# Patient Record
Sex: Female | Born: 1990 | Race: White | Hispanic: No | Marital: Single | Smoking: Never smoker
Health system: Southern US, Community
[De-identification: ages and names within clinical notes are randomized; demographics above are authoritative.]

## PROBLEM LIST (undated history)

## (undated) DIAGNOSIS — I7300 Raynaud's syndrome without gangrene: Secondary | ICD-10-CM

## (undated) DIAGNOSIS — R519 Headache, unspecified: Secondary | ICD-10-CM

## (undated) DIAGNOSIS — G43719 Chronic migraine without aura, intractable, without status migrainosus: Secondary | ICD-10-CM

## (undated) DIAGNOSIS — F5104 Psychophysiologic insomnia: Secondary | ICD-10-CM

## (undated) DIAGNOSIS — R51 Headache: Secondary | ICD-10-CM

## (undated) DIAGNOSIS — E079 Disorder of thyroid, unspecified: Secondary | ICD-10-CM

## (undated) DIAGNOSIS — G8929 Other chronic pain: Secondary | ICD-10-CM

## (undated) HISTORY — PX: WISDOM TOOTH EXTRACTION: SHX21

## (undated) HISTORY — DX: Raynaud's syndrome without gangrene: I73.00

## (undated) HISTORY — DX: Chronic migraine without aura, intractable, without status migrainosus: G43.719

## (undated) HISTORY — DX: Psychophysiologic insomnia: F51.04

---
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
## 2011-01-24 ENCOUNTER — Ambulatory Visit: Payer: Managed Care, Other (non HMO) | Attending: Neurology | Admitting: Physical Therapy

## 2011-01-24 DIAGNOSIS — IMO0001 Reserved for inherently not codable concepts without codable children: Secondary | ICD-10-CM | POA: Insufficient documentation

## 2011-01-24 DIAGNOSIS — M542 Cervicalgia: Secondary | ICD-10-CM | POA: Insufficient documentation

## 2011-01-24 DIAGNOSIS — R51 Headache: Secondary | ICD-10-CM | POA: Insufficient documentation

## 2011-01-31 ENCOUNTER — Ambulatory Visit: Payer: Managed Care, Other (non HMO) | Admitting: Physical Therapy

## 2011-02-07 ENCOUNTER — Ambulatory Visit: Payer: Managed Care, Other (non HMO) | Admitting: Physical Therapy

## 2011-02-09 ENCOUNTER — Encounter: Payer: Managed Care, Other (non HMO) | Admitting: Physical Therapy

## 2011-02-14 ENCOUNTER — Encounter: Payer: Managed Care, Other (non HMO) | Admitting: Physical Therapy

## 2011-11-18 ENCOUNTER — Emergency Department (HOSPITAL_COMMUNITY)
Admission: EM | Admit: 2011-11-18 | Discharge: 2011-11-18 | Disposition: A | Discharge status: Home / Self Care | Payer: Managed Care, Other (non HMO) | Attending: Emergency Medicine | Admitting: Emergency Medicine

## 2011-11-18 ENCOUNTER — Encounter (HOSPITAL_COMMUNITY): Payer: Self-pay | Admitting: *Deleted

## 2011-11-18 DIAGNOSIS — R51 Headache: Secondary | ICD-10-CM | POA: Insufficient documentation

## 2011-11-18 HISTORY — DX: Headache: R51

## 2011-11-18 HISTORY — DX: Other chronic pain: G89.29

## 2011-11-18 HISTORY — DX: Headache, unspecified: R51.9

## 2011-11-18 MED ORDER — DIPHENHYDRAMINE HCL 50 MG/ML IJ SOLN
25.0000 mg | Freq: Once | INTRAMUSCULAR | Status: AC
  Administered 2011-11-18: 25 mg via INTRAVENOUS

## 2011-11-18 MED ORDER — DEXAMETHASONE SODIUM PHOSPHATE 10 MG/ML IJ SOLN
10.0000 mg | Freq: Once | INTRAMUSCULAR | Status: AC
  Administered 2011-11-18: 10 mg via INTRAVENOUS
  Filled 2011-11-18: qty 1

## 2011-11-18 MED ORDER — LORAZEPAM 1 MG PO TABS: 1.0000 mg | ORAL_TABLET | Freq: Once | ORAL | Status: DC

## 2011-11-18 MED ORDER — SODIUM CHLORIDE 0.9 % IV BOLUS (SEPSIS)
1000.0000 mL | Freq: Once | INTRAVENOUS | Status: AC
  Administered 2011-11-18: 1000 mL via INTRAVENOUS

## 2011-11-18 MED ORDER — DIPHENHYDRAMINE HCL 50 MG/ML IJ SOLN
INTRAMUSCULAR | Status: AC
  Filled 2011-11-18: qty 1

## 2011-11-18 MED ORDER — LORAZEPAM 2 MG/ML IJ SOLN
INTRAMUSCULAR | Status: AC
  Filled 2011-11-18: qty 1

## 2011-11-18 MED ORDER — DIPHENHYDRAMINE HCL 50 MG/ML IJ SOLN
25.0000 mg | Freq: Once | INTRAMUSCULAR | Status: AC
  Administered 2011-11-18: 25 mg via INTRAVENOUS
  Filled 2011-11-18: qty 1

## 2011-11-18 MED ORDER — METOCLOPRAMIDE HCL 5 MG/ML IJ SOLN
10.0000 mg | Freq: Once | INTRAMUSCULAR | Status: AC
  Administered 2011-11-18: 10 mg via INTRAVENOUS
  Filled 2011-11-18: qty 2

## 2011-11-18 MED ORDER — LORAZEPAM 2 MG/ML IJ SOLN
1.0000 mg | Freq: Once | INTRAMUSCULAR | Status: AC
  Administered 2011-11-18: 23:00:00 via INTRAVENOUS

## 2011-11-18 NOTE — ED Notes (Signed)
Mother present- Neuro doctor called and was told to come here for evaluation.  Pt alert and active with care- no new symptoms-warm blanket given

## 2011-11-18 NOTE — ED Provider Notes (Signed)
History     CSN: [id number]  Arrival date & time 11/18/11  4098   First MD Initiated Contact with Patient 11/18/11 2218      Chief Complaint  Patient presents with  . Headache    see Dr Anne Hahn for headaches,  worse today  concentrated in back of head into neck    (Consider location/radiation/quality/duration/timing/severity/associated sxs/prior treatment) HPI History provided by pt and her mother.  Pt reports chronic, daily headaches x 2 years for which she sees Dr. Anne Hahn.  For the past two days, she has had a severe, constant, throbbing, occipital headache w/ radiation into neck and upper back.  No modifying factors.  No associated fever, photo/phonophobia, dizziness, vision changes, ataxia, N/V.  Characteristics of pain are typical w/ exception that the pain is worse than normal.  No recent trauma.  Has been compliant w/ her lyrica and amrix.  Her mother says that she been told by her neurologist not to take OTC pain medications.   Past Medical History  Diagnosis Date  . Chronic headaches     No past surgical history on file.  History reviewed. No pertinent family history.  History  Substance Use Topics  . Smoking status: Not on file  . Smokeless tobacco: Not on file  . Alcohol Use:     OB History    Grav Para Term Preterm Abortions TAB SAB Ect Mult Living                  Review of Systems  All other systems reviewed and are negative.    Allergies  Review of patient's allergies indicates no known allergies.  Home Medications   Current Outpatient Rx  Name Route Sig Dispense Refill  . CYCLOBENZAPRINE HCL ER 30 MG PO CP24 Oral Take 30 mg by mouth daily.    Marland Kitchen NORGESTIMATE-ETH ESTRADIOL 0.25-35 MG-MCG PO TABS Oral Take 1 tablet by mouth daily.    Marland Kitchen PREGABALIN 100 MG PO CAPS Oral Take 100 mg by mouth 2 (two) times daily.      BP 140/94  Pulse 105  Temp(Src) 98.8 F (37.1 C) (Oral)  Resp 20  Wt 140 lb (63.504 kg)  SpO2 100%  LMP 11/11/2011  Physical  Exam  Nursing note and vitals reviewed. Constitutional: She is oriented to person, place, and time. She appears well-developed and well-nourished.  HENT:  Head: Normocephalic and atraumatic.  Eyes:       Normal appearance  Neck: Normal range of motion. Neck supple. No rigidity. No Brudzinski's sign and no Kernig's sign noted.  Cardiovascular: Normal rate and regular rhythm.   Pulmonary/Chest: Effort normal and breath sounds normal.  Neurological: She is alert and oriented to person, place, and time. No cranial nerve deficit or sensory deficit. Coordination normal.       5/5 and equal upper and lower extremity strength.  No past pointing.    Skin: Skin is warm and dry. No rash noted.  Psychiatric: She has a normal mood and affect. Her behavior is normal.    ED Course  Procedures (including critical care time)  Labs Reviewed - No data to display No results found.   1. Headache       MDM  20yo F w/ daily headaches x 2 years for which she sees a neurologist, presents w/ 2d  non-traumatic, occipital headache.  Characteristics of pain typical w/ exception of severity.  Well-appearing, afebrile, no meningeal signs or focal neuro deficits on exam.  Pt receiving IV fluids,  reglan, decadron and benadryl.  Will reassess shortly.    Pt had a reaction to reglan.  Became restless and anxious and tearful and developed hives on chest and back.  Treated w/ a second dose of benadryl as well as 1mg  IV ativan.  Pt fell asleep but was easily aroused and VSS.  She reports that her headache is improved and she is ready for discharge.  She lives with her parents who are both present.  I advised them to add reglan to her list of allergies and f/u with Dr. Anne Hahn this week.  Return precautions discussed.         Otilio Miu, [place] 11/18/11 [phone]

## 2011-11-18 NOTE — ED Notes (Signed)
Headaches for two years,  "just worse today, down into neck"

## 2011-11-18 NOTE — ED Provider Notes (Signed)
Medical screening examination/treatment/procedure(s) were performed by non-physician practitioner and as supervising physician I was immediately available for consultation/collaboration.   Mikael Debell, MD 11/18/11 2344 

## 2011-11-18 NOTE — Discharge Instructions (Signed)
Follow up with your neurologist this week.  You should return to the ER if you develop worsening pain, associated fever or any other concerning symptoms.

## 2011-11-22 ENCOUNTER — Encounter (HOSPITAL_COMMUNITY): Payer: Self-pay | Admitting: Emergency Medicine

## 2011-11-22 ENCOUNTER — Emergency Department (HOSPITAL_COMMUNITY)
Admission: EM | Admit: 2011-11-22 | Discharge: 2011-11-22 | Disposition: A | Discharge status: Home / Self Care | Payer: Managed Care, Other (non HMO) | Attending: Emergency Medicine | Admitting: Emergency Medicine

## 2011-11-22 ENCOUNTER — Emergency Department (HOSPITAL_COMMUNITY): Payer: Managed Care, Other (non HMO)

## 2011-11-22 DIAGNOSIS — R42 Dizziness and giddiness: Secondary | ICD-10-CM | POA: Insufficient documentation

## 2011-11-22 DIAGNOSIS — R0602 Shortness of breath: Secondary | ICD-10-CM | POA: Insufficient documentation

## 2011-11-22 DIAGNOSIS — R000 Tachycardia, unspecified: Secondary | ICD-10-CM | POA: Insufficient documentation

## 2011-11-22 LAB — CBC
Hemoglobin: 14.6 g/dL (ref 12.0–15.0)
MCH: 27.9 pg (ref 26.0–34.0)
MCHC: 33.2 g/dL (ref 30.0–36.0)
MCV: 84.1 fL (ref 78.0–100.0)
RBC: 5.23 MIL/uL — ABNORMAL HIGH (ref 3.87–5.11)

## 2011-11-22 LAB — POCT I-STAT, CHEM 8
BUN: 14 mg/dL (ref 6–23)
Calcium, Ion: 1.18 mmol/L (ref 1.12–1.32)
Creatinine, Ser: 0.9 mg/dL (ref 0.50–1.10)
TCO2: 25 mmol/L (ref 0–100)

## 2011-11-22 MED ORDER — LORAZEPAM 1 MG PO TABS
1.0000 mg | ORAL_TABLET | Freq: Once | ORAL | Status: AC
  Administered 2011-11-22: 1 mg via ORAL
  Filled 2011-11-22: qty 1

## 2011-11-22 MED ORDER — ALBUTEROL SULFATE (5 MG/ML) 0.5% IN NEBU
5.0000 mg | INHALATION_SOLUTION | Freq: Once | RESPIRATORY_TRACT | Status: AC
  Administered 2011-11-22: 5 mg via RESPIRATORY_TRACT
  Filled 2011-11-22: qty 1

## 2011-11-22 NOTE — ED Notes (Signed)
Peak flow pre nebulizer 230, peak flow post nebulizer 260

## 2011-11-22 NOTE — Progress Notes (Signed)
Peak flow post 260 best out of 3

## 2011-11-22 NOTE — ED Notes (Signed)
RT reported peak flow was 230. Will alert PA

## 2011-11-22 NOTE — ED Provider Notes (Signed)
History     CSN: [id number]  Arrival date & time 11/22/11  1230   First MD Initiated Contact with Patient 11/22/11 1318      Chief Complaint  Patient presents with  . Shortness of Breath    (Consider location/radiation/quality/duration/timing/severity/associated sxs/prior treatment) HPI Comments: Patient with a history of chronic headaches presents emergency Department with chief complaint of shortness of breath.  Onset of symptoms began at 9 o'clock this morning and are described as not being able to take a full breath in.  Associated symptoms include lightheadedness.  Patient states she has not done anything differently with the exception of taking tramadol last evening at 9 p.m. for her headache.  Patient denies a feeling that her throat is closing, any lip or facial swelling, hives, difficulty swallowing, drooling, change in her voice, cough, chest pain or tightness, extremity swelling, claudication, fevers, night sweats or chills.  Patient has no other complaints at this time.  Patient's mother states that her daughter has been having mild anxiety lately and feels that this may be related. Pt is on birthcontrol, denies smoking, recent travel or surgery. No hx of CA.  Patient is a 21 y.o. female presenting with shortness of breath. The history is provided by the patient.  Shortness of Breath  Associated symptoms include shortness of breath. Pertinent negatives include no chest pain, no fever, no sore throat, no stridor, no cough and no wheezing.    Past Medical History  Diagnosis Date  . Chronic headaches     History reviewed. No pertinent past surgical history.  History reviewed. No pertinent family history.  History  Substance Use Topics  . Smoking status: Never Smoker   . Smokeless tobacco: Not on file  . Alcohol Use: No    OB History    Grav Para Term Preterm Abortions TAB SAB Ect Mult Living                  Review of Systems  Constitutional: Negative for fever,  chills and appetite change.  HENT: Negative for congestion, sore throat, drooling and trouble swallowing.   Eyes: Negative for visual disturbance.  Respiratory: Positive for shortness of breath. Negative for cough, wheezing and stridor.   Cardiovascular: Negative for chest pain and leg swelling.  Gastrointestinal: Negative for abdominal pain.  Genitourinary: Negative for dysuria, urgency and frequency.  Neurological: Positive for light-headedness. Negative for dizziness, syncope, weakness, numbness and headaches.  Psychiatric/Behavioral: Negative for confusion.    Allergies  Reglan  Home Medications   Current Outpatient Rx  Name Route Sig Dispense Refill  . CYCLOBENZAPRINE HCL ER 30 MG PO CP24 Oral Take 30 mg by mouth daily.    Marland Kitchen NORGESTIMATE-ETH ESTRADIOL 0.25-35 MG-MCG PO TABS Oral Take 1 tablet by mouth daily.    Marland Kitchen PREGABALIN 100 MG PO CAPS Oral Take 100 mg by mouth 2 (two) times daily.      BP 127/84  Pulse 120  Temp 98.3 F (36.8 C)  Resp 22  SpO2 100%  LMP 11/11/2011  Physical Exam  Nursing note and vitals reviewed. Constitutional: She is oriented to person, place, and time. She appears well-developed and well-nourished. No distress.  HENT:  Head: Normocephalic and atraumatic.  Mouth/Throat: Oropharynx is clear and moist and mucous membranes are normal.       No sign of airway obstruction. No edema of face, eyelids, lips, tongue, uvula.Marland Kitchen Uvula midline, no nasal congestion or drooling.  Tongue not elevated. No trismus.  Neck: Trachea normal, normal  range of motion and full passive range of motion without pain. Neck supple. Carotid bruit is not present. No tracheal deviation present.       No carotid bruits or stridor  Cardiovascular: Normal rate, regular rhythm, intact distal pulses and normal pulses.        Mild tachycardia, HR 109 Normal heart sounds, no murmur, JVD, intact distal pulses  Pulmonary/Chest: Effort normal. No stridor.       LCAB, pt able to speak in  full sentences, O2 sat 100% on RA  Musculoskeletal: Normal range of motion.  Neurological: She is alert and oriented to person, place, and time.  Skin: Skin is warm and intact. No rash noted. Rash is not urticarial. She is not diaphoretic.       Not diaphoretic. No urticaria, petechiae or purpura.   Psychiatric: She has a normal mood and affect. Her behavior is normal.    ED Course  Procedures (including critical care time)  Labs Reviewed - No data to display No results found.   No diagnosis found.  PF 230 pre neb, 260 post neb  MDM  SOB  Not likely cardiopulmonary etiology based on presentation,  PE, negative CXR &ddimer. Non concerning for allergic rxn. Pt able to maintain oxygen saturation of 100% on RA throughout hospital visit. Breathing improved with neb treatment and ativan. Pt in NAD prior to dc. Pt to f-u with PCP and is agreeable w plan to dc        Jaci Carrel, PA-C 11/22/11 1732

## 2011-11-22 NOTE — ED Notes (Signed)
Pt reports difficulty breathing since 9 am this morning. Pt able to speak in complete sentences. Pt denies pain but says she does not feel like she is able to get a full breath. Pt was seen here at [facility] for headaches that she is regularly scene by DR Anne Hahn. Pt dx is persistent headaches from Dr Anne Hahn.

## 2011-11-22 NOTE — Progress Notes (Signed)
Peak flow results 230 best out of 3

## 2011-11-22 NOTE — Discharge Instructions (Signed)
Shortness of Breath Shortness of breath (dyspnea) is the feeling of uneasy breathing. Shortness of breath does not always mean that there is a life-threatening illness. However, shortness of breath requires immediate medical care. CAUSES  Causes for shortness of breath include:  Not enough oxygen in the air (as with high altitudes or with a smoke-filled room).   Short-term (acute) lung disease, including:   Infections such as pneumonia.   Fluid in the lungs, such as heart failure.   A blood clot in the lungs (pulmonary embolism).   Lasting (chronic) lung diseases.   Heart disease (heart attack, angina, heart failure, and others).   Low red blood cells (anemia).   Poor physical fitness. This can cause shortness of breath when you exercise.   Chest or back injuries or stiffness.   Being overweight (obese).   Anxiety. This can make you feel like you are not getting enough air.  DIAGNOSIS  Serious medical problems can usually be found during your physical exam. Many tests may also be done to determine why you are having shortness of breath. Tests include:  Chest X-rays.   Lung function tests.   Blood tests.   Electrocardiography.   Exercise testing.   A cardiac echo.   Imaging scans.  Your caregiver may not be able to find a cause for your shortness of breath after your exam. In this case, it is important to have a follow-up exam with your caregiver as directed.  HOME CARE INSTRUCTIONS   Do not smoke. Smoking is a common cause of shortness of breath. Ask for help to stop smoking.   Avoid being around chemicals that may bother your breathing (paint fumes, dust).   Rest as needed. Slowly resume your usual activities.   If medicines were prescribed, take them as directed for the full length of time directed. This includes oxygen and any inhaled medicines.   Follow up with your caregiver as directed. Waiting to do so or failure to follow up could result in worsening of  your condition and possible disability or death.   Be sure you understand what to do or who to call if your shortness of breath worsens.  SEEK MEDICAL CARE IF:   Your condition does not improve in the time expected.   You have a hard time doing your normal activities even with rest.   You have any side effects or problems with the medicines prescribed.   You develop any new symptoms.  SEEK IMMEDIATE MEDICAL CARE IF:   Your shortness of breath is getting worse.   You feel lightheaded, faint, or develop a cough not controlled with medicines.   You start coughing up blood.   You have pain with breathing.   You have chest pain or pain in your arms, shoulders, or abdomen.   You have a fever.   You are unable to walk up stairs or exercise the way you normally do.   Your symptoms are getting worse.  Document Released: 05/01/2001 Document Revised: 07/26/2011 Document Reviewed: 12/17/2007 Exit[employer] Patient Information 2012 [employer]. 

## 2011-11-23 NOTE — ED Provider Notes (Signed)
History/physical exam/procedure(s) were performed by non-physician practitioner and as supervising physician I was immediately available for consultation/collaboration. I have reviewed all notes and am in agreement with care and plan.   Hilario Quarry, MD 11/23/11 [phone]

## 2012-02-06 ENCOUNTER — Ambulatory Visit: Payer: Managed Care, Other (non HMO) | Admitting: Emergency Medicine

## 2012-02-06 VITALS — BP 121/79 | HR 102 | Temp 97.6°F | Resp 16 | Ht 67.0 in | Wt 144.0 lb

## 2012-02-06 DIAGNOSIS — M5412 Radiculopathy, cervical region: Secondary | ICD-10-CM

## 2012-02-06 DIAGNOSIS — G5680 Other specified mononeuropathies of unspecified upper limb: Secondary | ICD-10-CM

## 2012-02-06 DIAGNOSIS — G5620 Lesion of ulnar nerve, unspecified upper limb: Secondary | ICD-10-CM

## 2012-02-06 DIAGNOSIS — M25519 Pain in unspecified shoulder: Secondary | ICD-10-CM

## 2012-02-06 NOTE — Progress Notes (Signed)
  Subjective:    Patient ID: Tracey Ellison, female    DOB: April 20, 1991, 21 y.o.   MRN: [id number]  Shoulder Pain  The pain is present in the neck, right fingers, right elbow and right shoulder. This is a new problem. The current episode started in the past 7 days. There has been a history of trauma. The problem occurs constantly. The problem has been gradually worsening. The quality of the pain is described as burning. The pain is moderate. Pertinent negatives include no fever, inability to bear weight, itching, joint locking, joint swelling, limited range of motion, numbness, stiffness or tingling. The symptoms are aggravated by activity and contact. She has tried OTC pain meds for the symptoms. The treatment provided no relief. Family history does not include gout or rheumatoid arthritis. There is no history of diabetes, gout, osteoarthritis or rheumatoid arthritis.      Review of Systems  Constitutional: Negative.  Negative for fever.  HENT: Negative.   Respiratory: Negative.   Gastrointestinal: Negative.   Musculoskeletal: Negative.  Negative for stiffness and gout.  Skin: Positive for color change (thumb is discolored). Negative for itching.  Neurological: Negative.  Negative for tingling and numbness.       Objective:   Physical Exam  Constitutional: She is oriented to person, place, and time. She appears well-developed and well-nourished.  HENT:  Head: Normocephalic and atraumatic.  Eyes: Conjunctivae are normal. Pupils are equal, round, and reactive to light. No scleral icterus.  Neck: Normal range of motion. Neck supple.  Cardiovascular: Normal rate and intact distal pulses.   Pulmonary/Chest: Effort normal and breath sounds normal.  Abdominal: Soft.  Musculoskeletal: Normal range of motion. She exhibits tenderness.       Cervical back: She exhibits tenderness.       Back:       Right forearm: She exhibits tenderness.       Arms: Neurological: She is alert and oriented to  person, place, and time. She exhibits normal muscle tone.  Skin: Skin is warm and dry.          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  Now has pain in the top-posterior right shoulder with pain over ulnar notch and down forearm into right thumb that feels itchy and cold and pallid at the tip.  She has a trigger point tenderness posterior shoulder which increases all her pain symptoms.  Capillary refill normal bilaterally.  Scapulocostal syndrome.    Discussed with mom and patient who elected to try trigger point injection and OTC aleve (double dose) and continue with her cyclobenzaprine and local heat.   Will follow up with her neurologist for new or worsened symptoms or failure to improve

## 2012-05-29 DIAGNOSIS — S139XXA Sprain of joints and ligaments of unspecified parts of neck, initial encounter: Secondary | ICD-10-CM | POA: Insufficient documentation

## 2012-10-24 IMAGING — CR DG CHEST 2V
2 series · 2 of 2 positions shown · non-contrast
Comparison: None.

CLINICAL DATA: Shortness of breath

CHEST - 2 VIEW

[w chest pa]
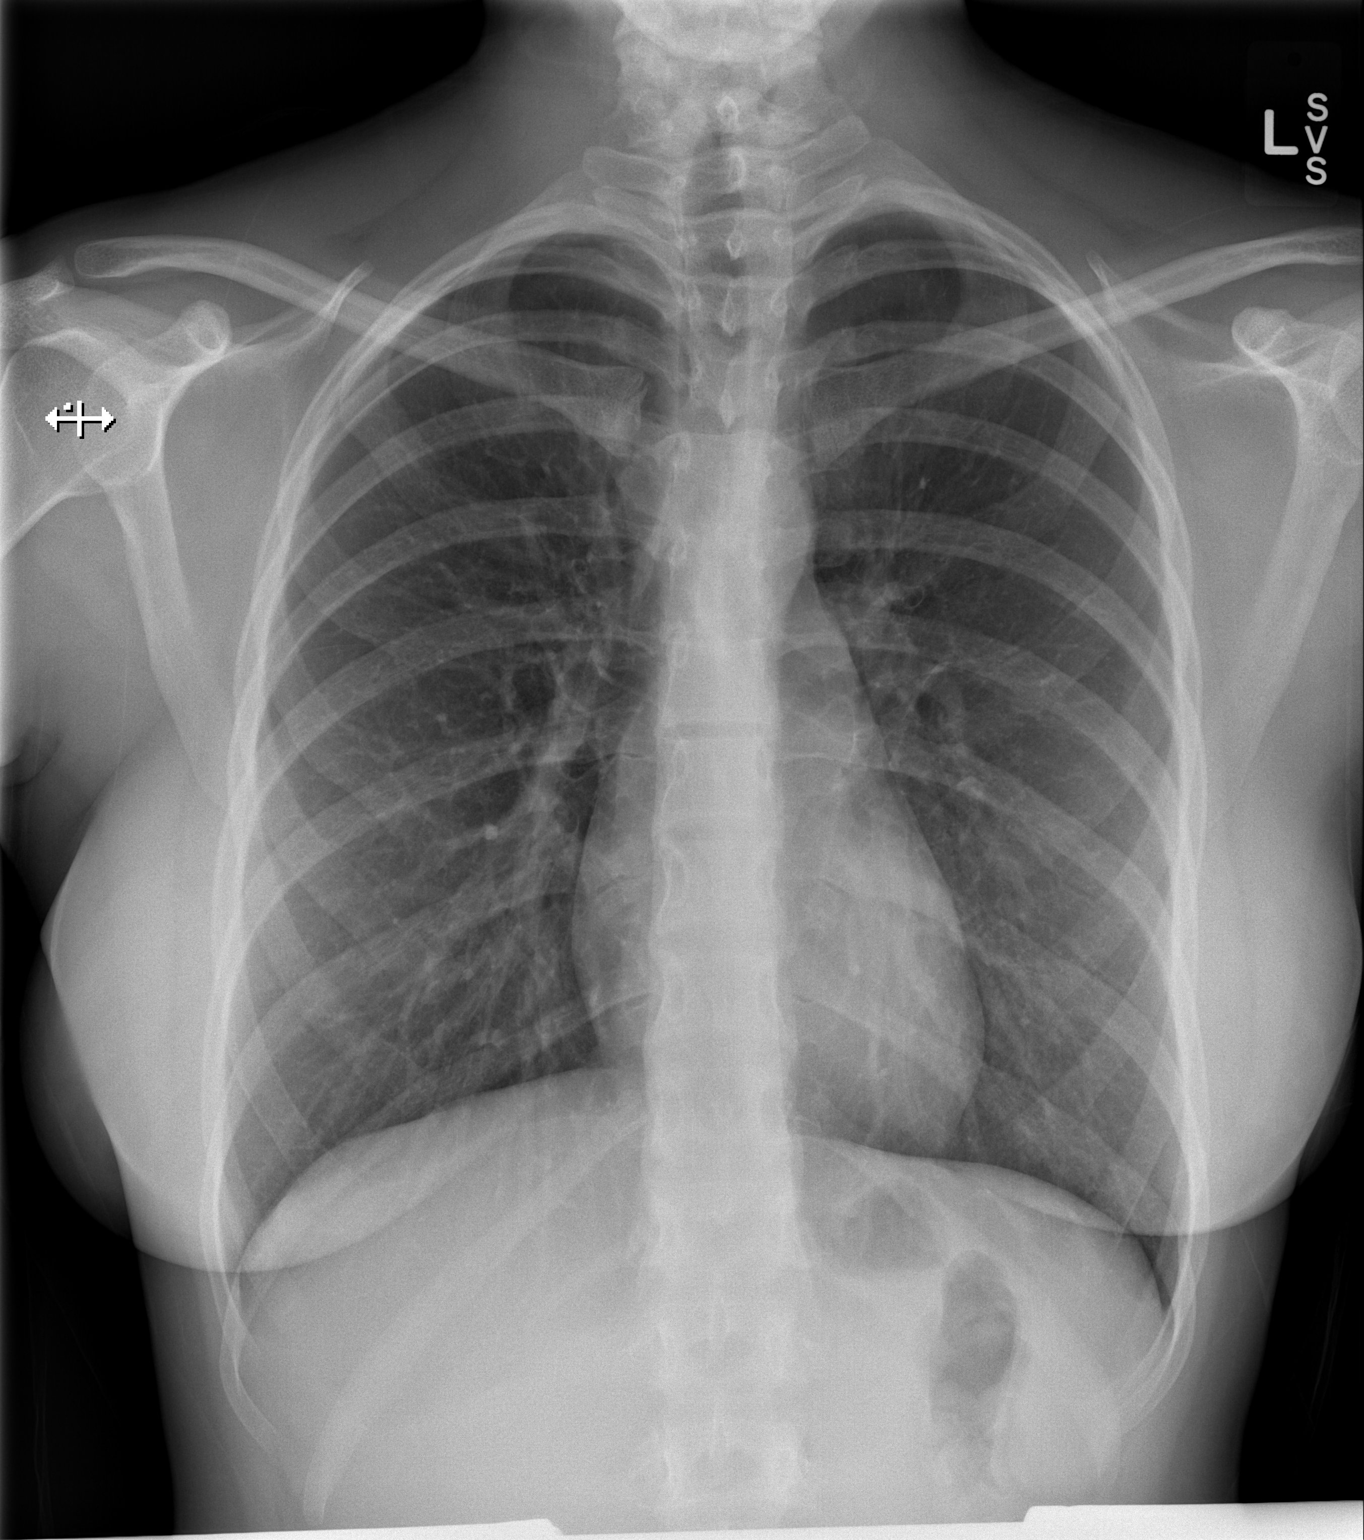

[w chest lat]
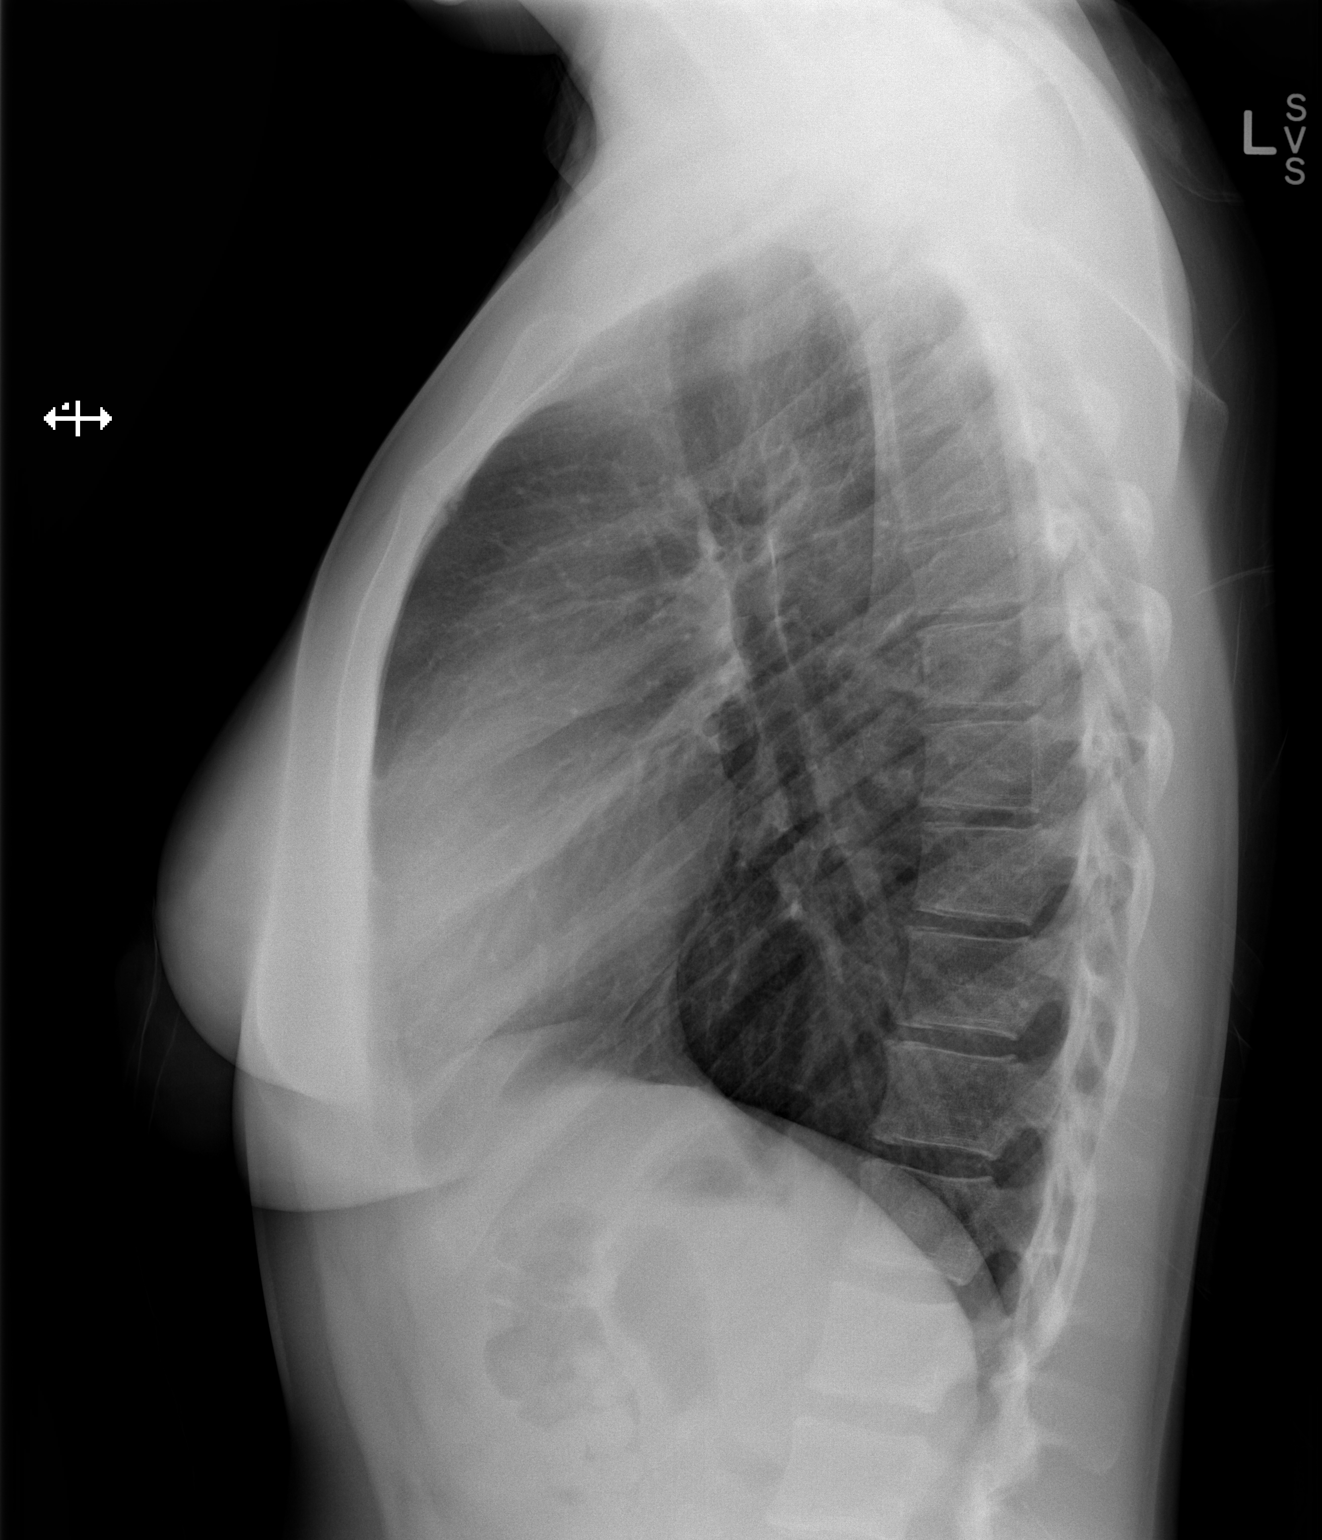

[2 of 2 positions shown; findings below may reference images not displayed]

FINDINGS: The lungs are clear.  Mediastinal contours appear normal.
The heart is within normal limits in size.  No bony abnormality is
seen.
IMPRESSION: No active lung disease.

## 2012-11-07 ENCOUNTER — Telehealth: Payer: Self-pay | Admitting: *Deleted

## 2012-11-07 NOTE — Telephone Encounter (Signed)
I called the mother. The patient has had some weight gain on Lexapro and Lyrica. These medications can cause weight gain. There is an interaction with Wellbutrin and Pristiq with the Amrix that she is on. We will keep her on her current dose. The patient will need to watch her diet, and exercise.

## 2012-11-07 NOTE — Telephone Encounter (Signed)
Patient's mother called stating the patient is gaining weight and its not helping the depression. Patient's mother would like to know if the lexapro or lyrica could be causing the patient weight.

## 2012-11-11 ENCOUNTER — Telehealth: Payer: Self-pay | Admitting: *Deleted

## 2012-11-11 MED ORDER — PREGABALIN 75 MG PO CAPS: 75.0000 mg | ORAL_CAPSULE | Freq: Two times a day (BID) | ORAL | Status: DC

## 2012-11-11 NOTE — Telephone Encounter (Signed)
I called the mother and I left a message. The patient has had some weight gain on the Lyrica, and I will decrease the dose to 75 mg twice daily from 100 mg twice daily.

## 2012-11-11 NOTE — Telephone Encounter (Signed)
Patient's mother called wanting to know if they can decrease the dosage of Lyrica.

## 2012-12-02 ENCOUNTER — Telehealth: Payer: Self-pay | Admitting: *Deleted

## 2012-12-02 MED ORDER — PREGABALIN 50 MG PO CAPS: 50.0000 mg | ORAL_CAPSULE | Freq: Two times a day (BID) | ORAL | Status: DC

## 2012-12-02 NOTE — Telephone Encounter (Signed)
Patient's mother called stating Tracey Ellison hasn't had any problem with the first decrease of Lyrica. Patient's mother would like another prescription decreasing the dose again another 25 mg, so she would be 50 mg in the morning and 50 mg in the evening.

## 2012-12-02 NOTE — Telephone Encounter (Signed)
I will decrease the dose to 50 mg twice daily of the Lyrica. I'll call in a prescription.

## 2012-12-08 ENCOUNTER — Other Ambulatory Visit: Payer: Self-pay | Admitting: Neurology

## 2012-12-27 ENCOUNTER — Encounter (HOSPITAL_BASED_OUTPATIENT_CLINIC_OR_DEPARTMENT_OTHER): Payer: Self-pay | Admitting: *Deleted

## 2012-12-27 ENCOUNTER — Emergency Department (HOSPITAL_BASED_OUTPATIENT_CLINIC_OR_DEPARTMENT_OTHER)
Admission: EM | Admit: 2012-12-27 | Discharge: 2012-12-28 | Disposition: A | Discharge status: Home / Self Care | Payer: Managed Care, Other (non HMO) | Attending: Emergency Medicine | Admitting: Emergency Medicine

## 2012-12-27 DIAGNOSIS — Z3202 Encounter for pregnancy test, result negative: Secondary | ICD-10-CM | POA: Insufficient documentation

## 2012-12-27 DIAGNOSIS — R51 Headache: Secondary | ICD-10-CM | POA: Insufficient documentation

## 2012-12-27 DIAGNOSIS — Z79899 Other long term (current) drug therapy: Secondary | ICD-10-CM | POA: Insufficient documentation

## 2012-12-27 MED ORDER — KETOROLAC TROMETHAMINE 30 MG/ML IJ SOLN
30.0000 mg | Freq: Once | INTRAMUSCULAR | Status: AC
  Administered 2012-12-27: 30 mg via INTRAVENOUS
  Filled 2012-12-27: qty 1

## 2012-12-27 MED ORDER — SODIUM CHLORIDE 0.9 % IV SOLN
Freq: Once | INTRAVENOUS | Status: AC
  Administered 2012-12-27: 22:00:00 via INTRAVENOUS

## 2012-12-27 MED ORDER — LORAZEPAM 2 MG/ML IJ SOLN
1.0000 mg | Freq: Once | INTRAMUSCULAR | Status: AC
  Administered 2012-12-27: 1 mg via INTRAVENOUS
  Filled 2012-12-27: qty 1

## 2012-12-27 NOTE — ED Provider Notes (Signed)
History     CSN: [id number]  Arrival date & time 12/27/12  1945   First MD Initiated Contact with Patient 12/27/12 2123      Chief Complaint  Patient presents with  . Headache    (Consider location/radiation/quality/duration/timing/severity/associated sxs/prior treatment) Patient is a 22 y.o. female presenting with headaches. The history is provided by the patient. No language interpreter was used.  Headache Pain location:  Generalized Quality:  Sharp Severity at highest:  10/10 Onset quality:  Gradual Duration:  1 day Timing:  Intermittent Progression:  Worsening Relieved by:  Nothing Worsened by:  Nothing tried Ineffective treatments:  Prescription medications Risk factors: no anger   Pt complains of a headache that has not resolved with current medications.   Past Medical History  Diagnosis Date  . Chronic headaches     Past Surgical History  Procedure Laterality Date  . Wisdom tooth extraction      History reviewed. No pertinent family history.  History  Substance Use Topics  . Smoking status: Never Smoker   . Smokeless tobacco: Not on file  . Alcohol Use: No    OB History   Grav Para Term Preterm Abortions TAB SAB Ect Mult Living                  Review of Systems  Neurological: Positive for headaches.  All other systems reviewed and are negative.    Allergies  Metoclopramide hcl  Home Medications   Current Outpatient Rx  Name  Route  Sig  Dispense  Refill  . cyclobenzaprine (AMRIX) 30 MG 24 hr capsule   Oral   Take 30 mg by mouth daily.         Marland Kitchen escitalopram (LEXAPRO) 20 MG tablet      TAKE 1 TABLET BY MOUTH EVERY DAY   90 tablet   1   . Norethindrone-Ethinyl Estradiol-Fe (GENERESS FE) 0.8-25 MG-MCG tablet   Oral   Chew 1 tablet by mouth daily.         . norgestimate-ethinyl estradiol (ORTHO-CYCLEN,SPRINTEC,PREVIFEM) 0.25-35 MG-MCG tablet   Oral   Take 1 tablet by mouth daily.         . pregabalin (LYRICA) 50 MG  capsule   Oral   Take 1 capsule (50 mg total) by mouth 2 (two) times daily.   60 capsule   3     BP 143/95  Pulse 100  Temp(Src) 97.8 F (36.6 C) (Oral)  Resp 16  Ht 5\' 7"  (1.702 m)  Wt 160 lb (72.576 kg)  BMI 25.05 kg/m2  SpO2 100%  LMP 12/27/2012  Physical Exam  Nursing note and vitals reviewed. Constitutional: She is oriented to person, place, and time. She appears well-developed and well-nourished.  HENT:  Head: Normocephalic.  Right Ear: External ear normal.  Left Ear: External ear normal.  Nose: Nose normal.  Mouth/Throat: Oropharynx is clear and moist.  Eyes: Conjunctivae and EOM are normal. Pupils are equal, round, and reactive to light.  Neck: Normal range of motion. Neck supple.  Cardiovascular: Normal rate and normal heart sounds.   Pulmonary/Chest: Effort normal and breath sounds normal.  Abdominal: Soft. Bowel sounds are normal.  Musculoskeletal: Normal range of motion.  Neurological: She is alert and oriented to person, place, and time. She has normal reflexes.  Skin: Skin is warm.  Psychiatric: She has a normal mood and affect.    ED Course  Procedures (including critical care time)  Labs Reviewed  PREGNANCY, URINE   No  results found.   No diagnosis found.    MDM   Pt feels better with Iv fluids , ativan and torodol.       Elson Areas, PA-C 12/27/12 2336  Medical screening examination/treatment/procedure(s) were performed by non-physician practitioner and as supervising physician I was immediately available for consultation/collaboration.  Derwood Kaplan, MD 12/29/12 [phone]

## 2012-12-27 NOTE — ED Notes (Signed)
Pt states she has a hx of headaches and this one started earlier today. Normally gets IV "cocktail". Denies other s/s.

## 2012-12-28 NOTE — ED Notes (Signed)
Medicated for migraine type headache states having some relief

## 2012-12-29 ENCOUNTER — Telehealth: Payer: Self-pay | Admitting: Neurology

## 2012-12-30 ENCOUNTER — Telehealth: Payer: Self-pay

## 2012-12-30 MED ORDER — KETOROLAC TROMETHAMINE 10 MG PO TABS: 10.0000 mg | ORAL_TABLET | Freq: Four times a day (QID) | ORAL | Status: DC | PRN

## 2012-12-30 MED ORDER — PROMETHAZINE HCL 25 MG PO TABS: 25.0000 mg | ORAL_TABLET | Freq: Four times a day (QID) | ORAL | Status: DC | PRN

## 2012-12-30 MED ORDER — PREGABALIN 75 MG PO CAPS: 75.0000 mg | ORAL_CAPSULE | Freq: Two times a day (BID) | ORAL | Status: DC

## 2012-12-30 NOTE — Telephone Encounter (Signed)
Duplicate phone call

## 2012-12-30 NOTE — Telephone Encounter (Signed)
Mom called clinic, left message.  She says daughter had such a bad headache over the weekend she had to take her to the ER.  States nothing they had at home helped.  She said the hospital gave patient Toradol 30mg  and Ativan 1mg  through an IV.  She would like to know if these meds can be prescribed to relieve headache.  Says daughter has the same headache today that she had over the weekend.  She took 1/2 tablet of Hydrocodone, and it did not work.  Indicates if she takes a full tablet, it will upset her stomach.  She would like a call back to discuss possible med options at [phone]

## 2012-12-30 NOTE — Telephone Encounter (Signed)
I called the mother. The patient has had 2 severe headaches within the last several days. These headaches required emergency room visits. The patient was given Ativan and Toradol, which seemed to help. I'll call in the oral Toradol tablets, and some Phenergan. The patient has gone back up on Lyrica taking 75 mg twice daily.

## 2013-01-02 ENCOUNTER — Telehealth: Payer: Self-pay | Admitting: *Deleted

## 2013-01-02 NOTE — Telephone Encounter (Signed)
Patient's mom called  wanting to know if Effexor is a option for [place], before backing down from Lexapro.

## 2013-01-02 NOTE — Telephone Encounter (Signed)
I called the  Mother and I left a message. I'll try calling back later. The Effexor can be used for headaches, and I have no problem with her going on this.

## 2013-01-04 MED ORDER — VENLAFAXINE HCL ER 37.5 MG PO CP24: ORAL_CAPSULE | ORAL | Status: DC

## 2013-01-04 NOTE — Telephone Encounter (Signed)
I called the patient, and I talked with the mother. We will consider coming off the Lexapro and going on Effexor. The patient will go down to 10 mg of Lexapro daily for 2 weeks, and then stop the medication. The patient will go on Effexor taking present 37.5 mg daily for 2 weeks, and then go to 75 mg daily. They will contact us if there are tolerance issues. The Effexor may help the headaches more.

## 2013-02-06 ENCOUNTER — Telehealth: Payer: Self-pay | Admitting: *Deleted

## 2013-02-06 NOTE — Telephone Encounter (Signed)
I called the mother. The patient will be going up on the Effexor taking 37.5 mg tablets twice daily. She may split dose this issue she wants to.

## 2013-02-06 NOTE — Telephone Encounter (Signed)
Message copied by Monico Blitz on Fri Feb 06, 2013 11:51 AM ------      Message from: Seth Bake      Created: Thu Feb 05, 2013  3:51 PM      Contact: Patients mother       Wilkie Aye, patients mother had her increase her Effexor to one in the AM and one in the PM.  Is this the right thing to do?  Please call. Wilkie Aye, patients mom, at  [phone]. ------

## 2013-02-06 NOTE — Telephone Encounter (Signed)
Message copied by Monico Blitz on Fri Feb 06, 2013 11:48 AM ------      Message from: Seth Bake      Created: Thu Feb 05, 2013  3:51 PM      Contact: Patients mother       Wilkie Aye, patients mother had her increase her Effexor to one in the AM and one in the PM.  Is this the right thing to do?  Please call. Wilkie Aye, patients mom, at  [phone]. ------

## 2013-02-26 ENCOUNTER — Encounter: Payer: Self-pay | Admitting: Neurology

## 2013-02-26 DIAGNOSIS — R51 Headache: Secondary | ICD-10-CM

## 2013-02-27 ENCOUNTER — Ambulatory Visit (INDEPENDENT_AMBULATORY_CARE_PROVIDER_SITE_OTHER): Payer: Managed Care, Other (non HMO) | Admitting: Neurology

## 2013-02-27 ENCOUNTER — Encounter: Payer: Self-pay | Admitting: Neurology

## 2013-02-27 VITALS — BP 138/85 | HR 119 | Ht 67.0 in | Wt 168.0 lb

## 2013-02-27 DIAGNOSIS — R51 Headache: Secondary | ICD-10-CM

## 2013-02-27 DIAGNOSIS — S139XXD Sprain of joints and ligaments of unspecified parts of neck, subsequent encounter: Secondary | ICD-10-CM

## 2013-02-27 MED ORDER — VENLAFAXINE HCL ER 75 MG PO CP24: 75.0000 mg | ORAL_CAPSULE | Freq: Two times a day (BID) | ORAL | Status: DC
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
## 2013-03-31 ENCOUNTER — Other Ambulatory Visit: Payer: Self-pay | Admitting: Neurology

## 2013-03-31 NOTE — Telephone Encounter (Signed)
Rx signed and faxed.

## 2013-05-31 ENCOUNTER — Other Ambulatory Visit: Payer: Self-pay | Admitting: Neurology

## 2013-06-25 ENCOUNTER — Other Ambulatory Visit: Payer: Self-pay

## 2013-07-09 ENCOUNTER — Telehealth: Payer: Self-pay | Admitting: Neurology

## 2013-07-09 MED ORDER — VENLAFAXINE HCL ER 75 MG PO CP24: ORAL_CAPSULE | ORAL | Status: DC

## 2013-07-09 NOTE — Telephone Encounter (Signed)
Spoke with patient and she said the medicine is not helping still crying a lot thru out day. Should she increase venlafaxine dosage?please call on mobile @ [phone].

## 2013-07-09 NOTE — Telephone Encounter (Signed)
I called patient. She still having some problems with depression, crying spells. I will increase the Effexor to the maximum dose taking 75 mg in the morning, on 150 mg in the evening.

## 2013-07-23 ENCOUNTER — Telehealth: Payer: Self-pay | Admitting: Neurology

## 2013-07-23 MED ORDER — HYDROCODONE-ACETAMINOPHEN 5-325 MG PO TABS: 1.0000 | ORAL_TABLET | Freq: Four times a day (QID) | ORAL | Status: DC | PRN

## 2013-07-23 NOTE — Telephone Encounter (Signed)
The pain medication that she has been given is not working at all. She has had increased headaches for the past three days and wants to know if she can go back on the RX for pain she was on.

## 2013-07-23 NOTE — Telephone Encounter (Signed)
Spoke with patient and she said that the toradol is not touching the headache pain, would like to go back on hydrocodone if possible

## 2013-07-23 NOTE — Telephone Encounter (Signed)
I called patient, left message. I will write a prescription for hydrocodone. Unfortunately, the patient will need to pick up the prescription, I cannot call this in.

## 2013-07-24 ENCOUNTER — Other Ambulatory Visit: Payer: Self-pay | Admitting: Neurology

## 2013-07-24 NOTE — Telephone Encounter (Signed)
Called patient and left message that her prescription was ready to be picked up and if she has any other problems, questions or concerns to call the office.

## 2013-09-08 ENCOUNTER — Other Ambulatory Visit: Payer: Self-pay | Admitting: Neurology

## 2013-09-08 ENCOUNTER — Telehealth: Payer: Self-pay | Admitting: Neurology

## 2013-09-08 NOTE — Telephone Encounter (Signed)
Tracey Spanielharles K Willis, MD at 07/09/2013 6:03 PM     Status: Signed        I called patient. She still having some problems with depression, crying spells. I will increase the Effexor to the maximum dose taking 75 mg in the morning, on 150 mg in the evening.

## 2013-09-08 NOTE — Telephone Encounter (Signed)
Dtr need to R/S due to college will be home March 12 &13 needs an appt on these days

## 2013-09-09 ENCOUNTER — Telehealth: Payer: Self-pay | Admitting: Neurology

## 2013-09-09 NOTE — Telephone Encounter (Signed)
NEEDS NEW RX CALLED IN FOR VENLAFAXINE 75MG --TAKES 2 IN MORNING AND 1 IN EVENING--DOSAGE  INCREASED--[facility] [phone]

## 2013-09-09 NOTE — Telephone Encounter (Signed)
This has already been taken care of

## 2013-09-16 ENCOUNTER — Ambulatory Visit: Payer: Managed Care, Other (non HMO) | Admitting: Neurology

## 2013-10-01 ENCOUNTER — Other Ambulatory Visit: Payer: Self-pay | Admitting: Neurology

## 2013-10-01 NOTE — Telephone Encounter (Signed)
Rx signed and faxed.

## 2013-10-30 ENCOUNTER — Ambulatory Visit (INDEPENDENT_AMBULATORY_CARE_PROVIDER_SITE_OTHER): Payer: Managed Care, Other (non HMO) | Admitting: Neurology

## 2013-10-30 ENCOUNTER — Encounter: Payer: Self-pay | Admitting: Neurology

## 2013-10-30 VITALS — BP 139/93 | HR 127 | Wt 177.0 lb

## 2013-10-30 DIAGNOSIS — R51 Headache: Secondary | ICD-10-CM
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
## 2013-11-21 ENCOUNTER — Other Ambulatory Visit: Payer: Self-pay | Admitting: Neurology

## 2014-01-29 ENCOUNTER — Encounter (HOSPITAL_COMMUNITY): Payer: Self-pay | Admitting: Emergency Medicine

## 2014-01-29 ENCOUNTER — Emergency Department (HOSPITAL_COMMUNITY)
Admission: EM | Admit: 2014-01-29 | Discharge: 2014-01-29 | Disposition: A | Discharge status: Home / Self Care | Payer: Managed Care, Other (non HMO) | Attending: Emergency Medicine | Admitting: Emergency Medicine

## 2014-01-29 ENCOUNTER — Emergency Department (HOSPITAL_COMMUNITY): Payer: Managed Care, Other (non HMO)

## 2014-01-29 DIAGNOSIS — S46909A Unspecified injury of unspecified muscle, fascia and tendon at shoulder and upper arm level, unspecified arm, initial encounter: Secondary | ICD-10-CM | POA: Insufficient documentation

## 2014-01-29 DIAGNOSIS — Y9389 Activity, other specified: Secondary | ICD-10-CM | POA: Insufficient documentation

## 2014-01-29 DIAGNOSIS — Z8679 Personal history of other diseases of the circulatory system: Secondary | ICD-10-CM | POA: Diagnosis not present

## 2014-01-29 DIAGNOSIS — F411 Generalized anxiety disorder: Secondary | ICD-10-CM | POA: Insufficient documentation

## 2014-01-29 DIAGNOSIS — IMO0002 Reserved for concepts with insufficient information to code with codable children: Secondary | ICD-10-CM | POA: Insufficient documentation

## 2014-01-29 DIAGNOSIS — Y92410 Unspecified street and highway as the place of occurrence of the external cause: Secondary | ICD-10-CM | POA: Insufficient documentation

## 2014-01-29 DIAGNOSIS — R000 Tachycardia, unspecified: Secondary | ICD-10-CM | POA: Diagnosis not present

## 2014-01-29 DIAGNOSIS — S4980XA Other specified injuries of shoulder and upper arm, unspecified arm, initial encounter: Secondary | ICD-10-CM | POA: Insufficient documentation

## 2014-01-29 DIAGNOSIS — Z79899 Other long term (current) drug therapy: Secondary | ICD-10-CM | POA: Insufficient documentation

## 2014-01-29 DIAGNOSIS — G8929 Other chronic pain: Secondary | ICD-10-CM | POA: Diagnosis not present
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
## 2014-01-30 NOTE — Progress Notes (Signed)
Chaplain Note: Provided emotional support and prayed with patient. Rutherford NailLeah Smith
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
## 2014-03-01 ENCOUNTER — Telehealth: Payer: Self-pay | Admitting: Neurology

## 2014-03-01 MED ORDER — HYDROMORPHONE HCL 2 MG PO TABS: 2.0000 mg | ORAL_TABLET | Freq: Four times a day (QID) | ORAL | Status: DC | PRN

## 2014-03-01 MED ORDER — VENLAFAXINE HCL ER 75 MG PO CP24: ORAL_CAPSULE | ORAL | Status: DC

## 2014-03-01 MED ORDER — PREGABALIN 75 MG PO CAPS: 75.0000 mg | ORAL_CAPSULE | Freq: Two times a day (BID) | ORAL | Status: DC

## 2014-03-01 NOTE — Telephone Encounter (Signed)
Patient's mother called the front desk asking for a new pain med for patient to take for headaches.  She told the operator that the patient was prescribed Dilaudid at ED and it worked well for severe headaches.   I called back to clarify info, got no answer on Mom's cell, no answer on home line and no answer on patient's cell. Please advise.  Thank you.

## 2014-03-01 NOTE — Telephone Encounter (Signed)
I called the patient, talked with her mother. The patient recently went to the emergency room on 02/17/2014. The patient was given Dilaudid which seemed to help, she tolerate the medication better at the 2 mg dose. I will write a prescription for the Dilaudid in place of the hydrocodone. The patient was off of Effexor, but the depression is worse, she will go back on the medication.

## 2014-03-02 NOTE — Telephone Encounter (Signed)
Called pt and left message informing her that her Rx was ready to be picked up at the front desk and if she has any other problems, questions or concerns to call the office.  °

## 2014-05-31 ENCOUNTER — Ambulatory Visit (INDEPENDENT_AMBULATORY_CARE_PROVIDER_SITE_OTHER): Payer: Managed Care, Other (non HMO) | Admitting: Neurology

## 2014-05-31 ENCOUNTER — Encounter: Payer: Self-pay | Admitting: Neurology

## 2014-05-31 VITALS — BP 124/92 | HR 90 | Ht 67.0 in | Wt 172.2 lb

## 2014-05-31 DIAGNOSIS — G441 Vascular headache, not elsewhere classified: Secondary | ICD-10-CM

## 2014-05-31 MED ORDER — PREGABALIN 50 MG PO CAPS: 50.0000 mg | ORAL_CAPSULE | Freq: Two times a day (BID) | ORAL | Status: DC

## 2014-05-31 NOTE — Progress Notes (Signed)
Reason for visit: Headache  Elna BreslowLindsey M Parker is an 23 y.o. female  History of present illness:  [name] is a 23 year old right-handed white female with a history of chronic daily headaches. The patient has not responded to various treatments in the past including Lyrica, Amrix, and Effexor. The patient did not respond to trigger point injections previously, or to the use of imipramine or Topamax. The patient in the past has had increased headaches when she tried to reduce the Lyrica dose, but the Lyrica is causing a lot of weight gain. She indicates that weather changes may worsen the headache at times. She continues to have a lot of neck discomfort and shoulder discomfort. The headaches are mainly posterior in nature. The patient is having a lot of depression associated with the weight gain she has sustained. She returns to this office for an evaluation.  Past Medical History  Diagnosis Date  . Chronic headaches   . Raynauds phenomenon     Past Surgical History  Procedure Laterality Date  . Wisdom tooth extraction      Family History  Problem Relation Age of Onset  . Hypertension Father   . Leukemia Neg Hx     Hx. of leukemia on father's side of family  . Lung cancer Neg Hx     Hx. of lung cancer on father's side of family  . Breast cancer Neg Hx     Hx. of breast cancer on father's side of family  . Cancer Neg Hx     Hx. of intestinal cancer on father's side of family    Social history:  reports that she has never smoked. She has never used smokeless tobacco. She reports that she does not drink alcohol or use illicit drugs.    Allergies  Allergen Reactions  . Metoclopramide Hcl Anaphylaxis    hives    Medications:  Current Outpatient Prescriptions on File Prior to Visit  Medication Sig Dispense Refill  . AMRIX 30 MG 24 hr capsule TAKE 1 CAPSULE BY MOUTH DAILY  90 capsule  1  . HYDROmorphone (DILAUDID) 2 MG tablet Take 1 tablet (2 mg total) by mouth every 6 (six)  hours as needed for severe pain. Must last 28 days  30 tablet  0  . Norethindrone-Ethinyl Estradiol-Fe (GENERESS FE) 0.8-25 MG-MCG tablet Chew 1 tablet by mouth daily.      . promethazine (PHENERGAN) 25 MG tablet Take 1 tablet (25 mg total) by mouth every 6 (six) hours as needed for nausea.  30 tablet  1  . SYNTHROID 50 MCG tablet Take 50 mcg by mouth daily.      Marland Kitchen. venlafaxine XR (EFFEXOR XR) 75 MG 24 hr capsule 1 capsule in the morning, 2 capsules in the evening  90 capsule  5   No current facility-administered medications on file prior to visit.    ROS:  Out of a complete 14 system review of symptoms, the patient complains only of the following symptoms, and all other reviewed systems are negative.  Joint pain, back pain, achy muscles, neck pain Headache Depression  Blood pressure 124/92, pulse 90, height 5\' 7"  (1.702 m), weight 172 lb 3.2 oz (78.109 kg).  Physical Exam  General: The patient is alert and cooperative at the time of the examination.  Neuromuscular: The patient has good range of movement of the cervical spine.  Skin: No significant peripheral edema is noted.   Neurologic Exam  Mental status: The patient is oriented x 3.  Cranial nerves: Facial symmetry is present. Speech is normal, no aphasia or dysarthria is noted. Extraocular movements are full. Visual fields are full.  Motor: The patient has good strength in all 4 extremities.  Sensory examination: Soft touch sensation is symmetric on the face, arms, and legs.  Coordination: The patient has good finger-nose-finger and heel-to-shin bilaterally.  Gait and station: The patient has a normal gait. Tandem gait is normal. Romberg is negative. No drift is seen.  Reflexes: Deep tendon reflexes are symmetric.   Assessment/Plan:  1. Chronic daily headache, probable migraine  The patient is having daily headaches at this time. She has not responded to multiple medical therapies in the past. At this point, we  will consider initiating Botox treatments for her chronic headache. The patient will followup in 4 months, but she may come in earlier if the Botox treatments can be approved.  Marlan Palau. Keith Willis MD 05/31/2014 7:27 PM  [facility] [address] [place], [place] [id number]  Phone [phone] Fax [phone]

## 2014-05-31 NOTE — Patient Instructions (Signed)

## 2014-06-03 ENCOUNTER — Other Ambulatory Visit: Payer: Self-pay | Admitting: Neurology

## 2014-06-07 ENCOUNTER — Other Ambulatory Visit: Payer: Self-pay | Admitting: Neurology

## 2014-06-07 MED ORDER — HYDROMORPHONE HCL 2 MG PO TABS: 2.0000 mg | ORAL_TABLET | Freq: Four times a day (QID) | ORAL | Status: DC | PRN

## 2014-06-07 NOTE — Telephone Encounter (Signed)
I called and left a message for the patient to let her know that her Rx is ready for pick up at the front desk.

## 2014-06-07 NOTE — Telephone Encounter (Signed)
Request forwarded to provider for approval  

## 2014-06-07 NOTE — Telephone Encounter (Signed)
Patient requesting Rx refill for HYDROmorphone (DILAUDID) 2 MG tablet.  Please call for pick up.

## 2014-06-11 ENCOUNTER — Other Ambulatory Visit: Payer: Self-pay | Admitting: Neurology

## 2014-06-15 NOTE — Telephone Encounter (Signed)
Patient's mother calling to check on the status of patient's Amrix refill, states that patient is running low and she goes to school in [place], please return call and advise: [phone].

## 2014-06-15 NOTE — Telephone Encounter (Signed)
Rx was sent on 10/15 and again on 10/25.  I called back.  Got no answer.  Left message.

## 2014-07-13 ENCOUNTER — Other Ambulatory Visit: Payer: Self-pay | Admitting: Neurology

## 2014-07-14 ENCOUNTER — Ambulatory Visit (INDEPENDENT_AMBULATORY_CARE_PROVIDER_SITE_OTHER): Payer: Managed Care, Other (non HMO) | Admitting: Neurology

## 2014-07-14 ENCOUNTER — Encounter: Payer: Self-pay | Admitting: Neurology

## 2014-07-14 DIAGNOSIS — G441 Vascular headache, not elsewhere classified: Secondary | ICD-10-CM

## 2014-07-14 DIAGNOSIS — G43719 Chronic migraine without aura, intractable, without status migrainosus: Secondary | ICD-10-CM

## 2014-07-14 MED ORDER — ONABOTULINUMTOXINA 100 UNITS IJ SOLR
200.0000 [IU] | Freq: Once | INTRAMUSCULAR | Status: AC
  Administered 2014-07-14: 200 [IU] via INTRAMUSCULAR

## 2014-07-14 NOTE — Progress Notes (Signed)
Reason for visit: Headache  Tracey Ellison is an 23 y.o. female accompanied by his parents, referred by Dr. Anne HahnWillis for Botox injection for her chronic headaches, this is her first injection  She had a history of daily headaches since February 2010, her headache usually stay at the skull base, spreading forward, pressure, constant, 4 out of 10 on daily basis, couple times a week,it would exacerbated to a much more severe headaches, she would have difficulty carrying out her routines, tends to lie down resting, but denies significant light, noise sensitivity, she tends to present to emergency room couple times each year for the treatment of her severe headaches,  Over the years, she was managed by different physician, including [facility], has tried trigger point injection 3 times, without improving her headaches, also tried imipramine, Topamax, Effexor, Lyrica, Amrix, without improving her headaches,  Her severe typical  headache last 1-2 days, respond to Dilaudid treatment, she is now getting Dilaudid prescription   She has tried triptan's treatment in the past, including Imitrex, Maxalt, Relpax, without helping her headaches, cause side effect, such as palpitation,  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 Chronic headaches   . Raynauds phenomenon     Past Surgical History  Procedure Laterality Date  . Wisdom tooth extraction      Family History  Problem Relation Age of Onset  . Hypertension Father   . Leukemia Neg Hx     Hx. of leukemia on father's side of family  . Lung cancer Neg Hx     Hx. of lung cancer on father's side of family  . Breast cancer Neg Hx     Hx. of breast cancer on father's side of family  . Cancer Neg Hx     Hx. of intestinal cancer on father's side of family    Social history:  reports that she has never smoked. She has never used smokeless tobacco. She reports that she does not drink  alcohol or use illicit drugs.    Allergies  Allergen Reactions  . Metoclopramide Hcl Anaphylaxis    hives    Medications:  Current Outpatient Prescriptions on File Prior to Visit  Medication Sig Dispense Refill  . AMRIX 30 MG 24 hr capsule TAKE 1 CAPSULE BY MOUTH DAILY 90 capsule 1  . AMRIX 30 MG 24 hr capsule TAKE 1 CAPSULE BY MOUTH DAILY 90 capsule 1  . doxycycline (VIBRAMYCIN) 100 MG capsule Take 100 mg by mouth daily.    Marland Kitchen. HYDROmorphone (DILAUDID) 2 MG tablet Take 1 tablet (2 mg total) by mouth every 6 (six) hours as needed for severe pain. Must last 28 days 30 tablet 0  . METRONIDAZOLE, TOPICAL, 0.75 % LOTN Apply 0.75 Bottles topically daily.    . Norethindrone-Ethinyl Estradiol-Fe (GENERESS FE) 0.8-25 MG-MCG tablet Chew 1 tablet by mouth daily.    . pregabalin (LYRICA) 50 MG capsule Take 1 capsule (50 mg total) by mouth 2 (two) times daily. 60 capsule 5  . promethazine (PHENERGAN) 25 MG tablet TAKE 1 TABLET BY MOUTH EVERY 6 HOURS AS NEEDED FOR NAUSEA 30 tablet 3  . SYNTHROID 50 MCG tablet Take 50 mcg by mouth daily.    Marland Kitchen. venlafaxine XR (EFFEXOR XR) 75 MG 24 hr capsule 1 capsule in the morning, 2 capsules in the evening 90 capsule 5   No current facility-administered medications on file prior to visit.    ROS:  Out of a complete 14 system review of symptoms, the patient complains only of the following symptoms, and all other reviewed systems are negative.  Joint pain, back pain, achy muscles, neck pain Headache Depression  Blood pressure , pulse , height 0' (0 m), weight 0 lb (0 kg).  Physical Exam  General: The patient is alert and cooperative at the time of the examination.  Neuromuscular: The patient has good range of movement of the cervical spine.  Skin: No significant peripheral edema is noted.   Neurologic Exam  Mental status: The patient is oriented x 3.  Cranial nerves: Facial symmetry is present. Speech is normal, no aphasia or dysarthria is noted.  Extraocular movements are full. Visual fields are full.  Motor: The patient has good strength in all 4 extremities.  Sensory examination: Soft touch sensation is symmetric on the face, arms, and legs.  Coordination: The patient has good finger-nose-finger and heel-to-shin bilaterally.  Gait and station: The patient has a normal gait. Tandem gait is normal. Romberg is negative. No drift is seen.  Reflexes: Deep tendon reflexes are symmetric.   Assessment/Plan:  Chronic daily headache, probable migraine, failed multiple preventative medications in the past, including imipramine, Topamax, Lyrica, Effexor,   I have explained to her, and her family about potential long-term side effect of Botox injection, they agreed on the procedure.  BOTOX injection was performed according to protocol by [employer]. 100 units of BOTOX was dissolved into 2 cc NS. (Lot No.[id number]).  Total of 155 units, discard 45 units.   Corrugator 2 sites, 10 units Procerus 1 site, 5 unit Frontalis 4 sites,  20 units, Temporalis 8 sites,  40 units  Occipitalis 6 sites, 30 units Cervical Paraspinal, 4 sites, 20 units Trapezius, 6 sites, 30 units  Patient tolerate the injection well. Will return for repeat injection in 3 months.   Levert FeinsteinYijun Josaphine Shimamoto, M.D. Ph.D.

## 2014-08-31 ENCOUNTER — Other Ambulatory Visit: Payer: Self-pay

## 2014-08-31 MED ORDER — VENLAFAXINE HCL ER 75 MG PO CP24: ORAL_CAPSULE | ORAL | Status: DC

## 2014-09-13 ENCOUNTER — Telehealth: Payer: Self-pay | Admitting: Neurology

## 2014-09-13 MED ORDER — DEXAMETHASONE 2 MG PO TABS: ORAL_TABLET | ORAL | Status: DC

## 2014-09-13 NOTE — Telephone Encounter (Signed)
Patient's mother, stated patient suffered with really bad migraines over the weekend.  Went to ER and IV cocktail helped mildly, didn't seem to touch left side of head.  Please call and advise.  If miss call with mother, please call Mardella LaymanLindsey @ [phone].

## 2014-09-13 NOTE — Telephone Encounter (Signed)
I called the patient. The patient has had headaches for the last 5 days. She has gone to the emergency room, has received IV fluids and Dilaudid. This has not been effective. The patient will be placed on a Decadron taper. [place] in [place] in [place]. She is getting Botox injections.

## 2014-09-20 ENCOUNTER — Encounter: Payer: Self-pay | Admitting: Neurology

## 2014-10-01 ENCOUNTER — Ambulatory Visit: Payer: Managed Care, Other (non HMO) | Admitting: Neurology

## 2014-10-06 ENCOUNTER — Other Ambulatory Visit: Payer: Self-pay | Admitting: Neurology

## 2014-10-06 MED ORDER — HYDROMORPHONE HCL 2 MG PO TABS: 2.0000 mg | ORAL_TABLET | Freq: Four times a day (QID) | ORAL | Status: DC | PRN

## 2014-10-06 NOTE — Telephone Encounter (Signed)
Request entered, forwarded to provider for approval.  

## 2014-10-06 NOTE — Telephone Encounter (Signed)
Patient's mother, Wilkie AyeKristy @ [phone], calling for refill request for Rx HYDROmorphone (DILAUDID) 2 MG tablet.  Please call mother when ready for pick up.

## 2014-10-07 ENCOUNTER — Telehealth: Payer: Self-pay

## 2014-10-07 NOTE — Telephone Encounter (Signed)
Called patient to inform Rx ready for pick up at front desk. No answer. Left vmail. 

## 2014-10-26 ENCOUNTER — Encounter: Payer: Self-pay | Admitting: Neurology

## 2014-10-26 ENCOUNTER — Ambulatory Visit (INDEPENDENT_AMBULATORY_CARE_PROVIDER_SITE_OTHER): Payer: Managed Care, Other (non HMO) | Admitting: Neurology

## 2014-10-26 VITALS — BP 141/89 | HR 114

## 2014-10-26 DIAGNOSIS — G43719 Chronic migraine without aura, intractable, without status migrainosus: Secondary | ICD-10-CM | POA: Diagnosis not present

## 2014-10-26 DIAGNOSIS — G43711 Chronic migraine without aura, intractable, with status migrainosus: Secondary | ICD-10-CM

## 2014-10-26 HISTORY — DX: Chronic migraine without aura, intractable, without status migrainosus: G43.719

## 2014-10-26 MED ORDER — ONABOTULINUMTOXINA 100 UNITS IJ SOLR
200.0000 [IU] | Freq: Once | INTRAMUSCULAR | Status: AC
  Administered 2014-10-26: 200 [IU] via INTRAMUSCULAR

## 2014-10-26 NOTE — Progress Notes (Signed)
Please refer to Botox procedure note. 

## 2014-10-26 NOTE — Procedures (Signed)
     BOTOX PROCEDURE NOTE FOR MIGRAINE HEADACHE   HISTORY: Tracey Ellison is a 24 year old patient with a straight intractable migraine headaches. She continues to have daily headaches varying severity. She is having about 4 days a month where she is unable to function, she will miss class, and remain in bed all day. The patient denies any nausea vomiting. She has had Botox previously that helped for about 3 weeks, then the headaches ensued with her usual severity again. All medical therapy previously has been ineffective for her headaches. She went to the emergency room 2 months ago for a severe headache. Injections in the ER did not help. She comes in today for a Botox treatment for migraine.   Description of procedure:  The patient was placed in a sitting position. The standard protocol was used for Botox as follows, with 5 units of Botox injected at each site:   -Procerus muscle, midline injection  -Corrugator muscle, bilateral injection  -Frontalis muscle, bilateral injection, with 2 sites each side, medial injection was performed in the upper one third of the frontalis muscle, in the region vertical from the medial inferior edge of the superior orbital rim. The lateral injection was again in the upper one third of the forehead vertically above the lateral limbus of the cornea, 1.5 cm lateral to the medial injection site.  -Temporalis muscle injection, 4 sites, bilaterally. The first injection was 3 cm above the tragus of the ear, second injection site was 1.5 cm to 3 cm up from the first injection site in line with the tragus of the ear. The third injection site was 1.5-3 cm forward between the first 2 injection sites. The fourth injection site was 1.5 cm posterior to the second injection site.  -Occipitalis muscle injection, 3 sites, bilaterally. The first injection was done one half way between the occipital protuberance and the tip of the mastoid process behind the ear. The second  injection site was done lateral and superior to the first, 1 fingerbreadth from the first injection. The third injection site was 1 fingerbreadth superiorly and medially from the first injection site.  -Cervical paraspinal muscle injection, 2 sites, bilateral knee first injection site was 1 cm from the midline of the cervical spine, 3 cm inferior to the lower border of the occipital protuberance. The second injection site was 1.5 cm superiorly and laterally to the first injection site.  -Trapezius muscle injection was performed at 3 sites, bilaterally. The first injection site was in the upper trapezius muscle halfway between the inflection point of the neck, and the acromion. The second injection site was one half way between the acromion and the first injection site. The third injection was done between the first injection site and the inflection point of the neck.   A 200 unit bottle of Botox was used, 155 units were injected, the rest of the Botox was wasted. The patient tolerated the procedure well, there were no complications of the above procedure.  Botox NDC [id number] Lot number [id number] Expiration date 10/18

## 2014-11-12 ENCOUNTER — Telehealth: Payer: Self-pay | Admitting: Neurology

## 2014-11-12 MED ORDER — PREGABALIN 25 MG PO CAPS: 25.0000 mg | ORAL_CAPSULE | Freq: Two times a day (BID) | ORAL | Status: DC

## 2014-11-12 NOTE — Telephone Encounter (Signed)
Pt's mother called and wants to know if they can lower the dose for pregabalin (LYRICA) 50 MG capsule to 25mg . Needs new Rx called to [facility] [address] In [place], [place].  Please advise.

## 2014-11-12 NOTE — Telephone Encounter (Signed)
I called the patient. I talk with the mother. The patient is trying to back off of the Lyrica slowly, we will go down to the 25 mg capsule taking one twice daily. This was called into the pharmacy in [place], [place].

## 2014-12-13 ENCOUNTER — Other Ambulatory Visit: Payer: Self-pay | Admitting: Neurology

## 2014-12-13 MED ORDER — HYDROMORPHONE HCL 2 MG PO TABS: 2.0000 mg | ORAL_TABLET | Freq: Four times a day (QID) | ORAL | Status: DC | PRN

## 2014-12-13 NOTE — Telephone Encounter (Signed)
Request entered, forwarded to provider for approval.  

## 2014-12-13 NOTE — Telephone Encounter (Signed)
Patient's mother is calling for a written Rx Dilaudid 2 mg. Thanks!

## 2014-12-15 ENCOUNTER — Other Ambulatory Visit: Payer: Self-pay | Admitting: Neurology

## 2014-12-17 ENCOUNTER — Other Ambulatory Visit: Payer: Self-pay | Admitting: Neurology

## 2014-12-17 NOTE — Telephone Encounter (Signed)
Rx signed and faxed.

## 2015-01-24 ENCOUNTER — Encounter: Payer: Self-pay | Admitting: Neurology

## 2015-01-24 ENCOUNTER — Ambulatory Visit (INDEPENDENT_AMBULATORY_CARE_PROVIDER_SITE_OTHER): Payer: Managed Care, Other (non HMO) | Admitting: Neurology

## 2015-01-24 VITALS — BP 152/90 | HR 120

## 2015-01-24 DIAGNOSIS — G43719 Chronic migraine without aura, intractable, without status migrainosus: Secondary | ICD-10-CM | POA: Diagnosis not present

## 2015-01-24 DIAGNOSIS — G43711 Chronic migraine without aura, intractable, with status migrainosus: Secondary | ICD-10-CM

## 2015-01-24 MED ORDER — ONABOTULINUMTOXINA 100 UNITS IJ SOLR
200.0000 [IU] | Freq: Once | INTRAMUSCULAR | Status: AC
  Administered 2015-01-24: 200 [IU] via INTRAMUSCULAR

## 2015-01-24 NOTE — Progress Notes (Signed)
Please refer to Botox injection procedure note.

## 2015-01-24 NOTE — Procedures (Signed)
     BOTOX PROCEDURE NOTE FOR MIGRAINE HEADACHE   HISTORY: Tracey Ellison is a 24 year old patient with a history of intractable migraine headache. The patient continues to have daily headaches, but she indicates that the Botox injection last received significant reduce the severity of her headaches for about 6 weeks following the procedure. The patient has not had any emergency room visits since January 2016. She has had 2 incapacitating headache since last seen. The headaches are not associated with photophobia or phonophobia or nausea or vomiting. The patient is gradually reducing the Lyrica dose, currently on 50 mg in the morning and 25 mg in the evening. She has had issues with weight gain on the medication. She returns for a Botox injection today. The headaches generally will last all day long. The patient believes that the Botox is only thing that has helped her so far regarding headache medication management.   Description of procedure:  The patient was placed in a sitting position. The standard protocol was used for Botox as follows, with 5 units of Botox injected at each site:   -Procerus muscle, midline injection  -Corrugator muscle, bilateral injection  -Frontalis muscle, bilateral injection, with 2 sites each side, medial injection was performed in the upper one third of the frontalis muscle, in the region vertical from the medial inferior edge of the superior orbital rim. The lateral injection was again in the upper one third of the forehead vertically above the lateral limbus of the cornea, 1.5 cm lateral to the medial injection site.  -Temporalis muscle injection, 4 sites, bilaterally. The first injection was 3 cm above the tragus of the ear, second injection site was 1.5 cm to 3 cm up from the first injection site in line with the tragus of the ear. The third injection site was 1.5-3 cm forward between the first 2 injection sites. The fourth injection site was 1.5 cm posterior to  the second injection site.  -Occipitalis muscle injection, 3 sites, bilaterally. The first injection was done one half way between the occipital protuberance and the tip of the mastoid process behind the ear. The second injection site was done lateral and superior to the first, 1 fingerbreadth from the first injection. The third injection site was 1 fingerbreadth superiorly and medially from the first injection site.  -Cervical paraspinal muscle injection, 2 sites, bilateral, the first injection site was 1 cm from the midline of the cervical spine, 3 cm inferior to the lower border of the occipital protuberance. The second injection site was 1.5 cm superiorly and laterally to the first injection site.  -Trapezius muscle injection was performed at 3 sites, bilaterally. The first injection site was in the upper trapezius muscle halfway between the inflection point of the neck, and the acromion. The second injection site was one half way between the acromion and the first injection site. The third injection was done between the first injection site and the inflection point of the neck.   A 200 unit bottle of Botox was used, 155 units were injected, the rest of the Botox was wasted. The patient tolerated the procedure well, there were no complications of the above procedure.  Botox NDC [id number] Lot number [id number] Expiration date 12/18

## 2015-02-04 ENCOUNTER — Telehealth: Payer: Self-pay | Admitting: Neurology

## 2015-02-04 MED ORDER — ORLISTAT 120 MG PO CAPS: 120.0000 mg | ORAL_CAPSULE | Freq: Three times a day (TID) | ORAL | Status: DC

## 2015-02-04 NOTE — Telephone Encounter (Signed)
I called patient. The patient wishes to have something to help her lose weight. I will call in Xenical for her.

## 2015-02-04 NOTE — Telephone Encounter (Signed)
I called the patient. She would like to know if she could take some sort of appetite suppressant to help her lose the weight she has gained on Lyrica and Effexor. She states that since January she has been eating better and working out multiple times a week but is not able to get rid of the weight she has gained since starting those medications.

## 2015-02-04 NOTE — Telephone Encounter (Signed)
Patient is calling to ask questions about a weight loss medication you might suggest. Please call.

## 2015-02-14 ENCOUNTER — Telehealth: Payer: Self-pay | Admitting: Neurology

## 2015-02-14 MED ORDER — PHENTERMINE HCL 15 MG PO CAPS: 15.0000 mg | ORAL_CAPSULE | ORAL | Status: DC

## 2015-02-14 NOTE — Telephone Encounter (Signed)
Patient is calling as she went to pick up her Rx Orlistal 120 mg and the cost was about $600.  She cannot afford.  Can you call in something else.  Please call!

## 2015-02-14 NOTE — Telephone Encounter (Signed)
I called patient. The patient is not able to afford Xenical, I will call in phenteramine 15 mg daily.

## 2015-02-17 NOTE — Telephone Encounter (Signed)
Patient called stating the pharmacy in [place] is stating they do not have RX. Please call and advise.

## 2015-02-17 NOTE — Telephone Encounter (Signed)
I called the patient back to verify pharmacy info.  She still uses [facility] [place].  I called the pharmacy and verified Rx via doctor line.  Patient is aware.

## 2015-02-22 ENCOUNTER — Telehealth: Payer: Self-pay

## 2015-02-22 NOTE — Telephone Encounter (Signed)
Left voicemail asking the patient to call back to schedule her next Botox injection. Must be after 9/6.

## 2015-02-25 NOTE — Telephone Encounter (Signed)
Appointment scheduled 9/16 at 12 PM.

## 2015-03-07 ENCOUNTER — Other Ambulatory Visit: Payer: Self-pay | Admitting: Neurology

## 2015-03-07 ENCOUNTER — Telehealth: Payer: Self-pay | Admitting: Neurology

## 2015-03-07 ENCOUNTER — Telehealth: Payer: Self-pay

## 2015-03-07 MED ORDER — HYDROMORPHONE HCL 2 MG PO TABS: 2.0000 mg | ORAL_TABLET | Freq: Four times a day (QID) | ORAL | Status: DC | PRN

## 2015-03-07 MED ORDER — PHENTERMINE HCL 30 MG PO CAPS: 30.0000 mg | ORAL_CAPSULE | ORAL | Status: DC

## 2015-03-07 NOTE — Telephone Encounter (Signed)
I called the patient. She stated that she has been taking the phentermine for a few weeks now and she actually feels like she is eating more. She cannot tell that it is helping with her appetite and would like to increase the dose, if possible.

## 2015-03-07 NOTE — Telephone Encounter (Signed)
Patient called and requested a refill on Rx. HYDROmorphone (DILAUDID) 2 MG tablet.

## 2015-03-07 NOTE — Telephone Encounter (Signed)
I called patient. The patient does not believe that the 15 mg phentermine tablet has affected her appetite, I'll call in a 30 mg capsule.

## 2015-03-07 NOTE — Telephone Encounter (Signed)
Patient called and requested to speak with nurse regarding her medications Rx. phentermine 15 MG capsule. Please call and advise.

## 2015-03-07 NOTE — Telephone Encounter (Signed)
Rx ready for pick up. 

## 2015-03-07 NOTE — Telephone Encounter (Signed)
Request entered, forwarded to provider for approval.  

## 2015-04-21 ENCOUNTER — Telehealth: Payer: Self-pay

## 2015-04-21 NOTE — Telephone Encounter (Signed)
Left voicemail asking patient to call back to r/s next appointment d/t Dr. Anne Hahn not being in the office 9/16.

## 2015-04-26 NOTE — Telephone Encounter (Signed)
Patient returned Kelby's phone call and she understands Dr Anne Hahn will not be in on the 16th. She will be coming in from school at [place] that day and is inquiring if another Dr in the practice could give the botox injection that day. Please call and advise.

## 2015-04-26 NOTE — Telephone Encounter (Signed)
I called the patient. She will come in a 8 AM on 9/16. Dr. Anne Hahn will be here for a couple hours in the morning.

## 2015-05-05 ENCOUNTER — Ambulatory Visit (INDEPENDENT_AMBULATORY_CARE_PROVIDER_SITE_OTHER): Payer: Managed Care, Other (non HMO) | Admitting: Neurology

## 2015-05-05 VITALS — BP 138/92 | HR 86 | Ht 67.0 in | Wt 182.0 lb

## 2015-05-05 DIAGNOSIS — G43711 Chronic migraine without aura, intractable, with status migrainosus: Secondary | ICD-10-CM | POA: Diagnosis not present

## 2015-05-05 NOTE — Progress Notes (Signed)
**  Botox Lot [id number], Exp 12/2017, NDC 0023-1145-01, Specialty Pharmacy**mck,rn 

## 2015-05-05 NOTE — Progress Notes (Signed)
Reason for visit: Headache  Tracey Ellison is an 24 y.o. female accompanied by his parents, referred by Dr. Anne Hahn for Botox injection for her chronic headaches, this is her first injection  She had a history of daily headaches since February 2010, her headache usually stay at the skull base, spreading forward, pressure, constant, 4 out of 10 on daily basis, couple times a week,it would exacerbated to a much more severe headaches, she would have difficulty carrying out her routines, tends to lie down resting, but denies significant light, noise sensitivity, she tends to present to emergency room couple times each year for the treatment of her severe headaches,  Over the years, she was managed by different physician, including [facility], has tried trigger point injection 3 times, without improving her headaches, also tried imipramine, Topamax, Effexor, Lyrica, Amrix, without improving her headaches,  Her severe typical  headache last 1-2 days, respond to Dilaudid treatment, she is now getting Dilaudid prescription   She has tried triptan's treatment in the past, including Imitrex, Maxalt, Relpax, without helping her headaches, cause side effect, such as palpitation,  She is now a Archivist at [employer]   UPDATE May 05 2015:  She started Botox injection as migraine prevention since November 2015, received it every 3 months, responded very well, with repeat injection, the benefit seems to last longer, she denies significant side effect  Last injection was by Dr. Anne Hahn in June 2016, over the past 4 weeks, she has almost daily constant mild pressure headaches, but no typical migraine features.   Past Medical History  Diagnosis Date  . Chronic headaches   . Raynauds phenomenon   . Intractable chronic migraine without aura 10/26/2014    Past Surgical History  Procedure Laterality Date  . Wisdom tooth extraction      Family History  Problem Relation Age of  Onset  . Hypertension Father   . Leukemia Neg Hx     Hx. of leukemia on father's side of family  . Lung cancer Neg Hx     Hx. of lung cancer on father's side of family  . Breast cancer Neg Hx     Hx. of breast cancer on father's side of family  . Cancer Neg Hx     Hx. of intestinal cancer on father's side of family    Social history:  reports that she has never smoked. She has never used smokeless tobacco. She reports that she does not drink alcohol or use illicit drugs.    Allergies  Allergen Reactions  . Metoclopramide Hcl Anaphylaxis    hives  . Decadron [Dexamethasone]     anxiety    Medications:  Current Outpatient Prescriptions on File Prior to Visit  Medication Sig Dispense Refill  . AMRIX 30 MG 24 hr capsule TAKE 1 CAPSULE BY MOUTH DAILY 90 capsule 1  . AMRIX 30 MG 24 hr capsule TAKE 1 CAPSULE BY MOUTH DAILY 90 capsule 1  . AMRIX 30 MG 24 hr capsule TAKE 1 CAPSULE BY MOUTH DAILY 90 capsule 1  . HYDROmorphone (DILAUDID) 2 MG tablet Take 1 tablet (2 mg total) by mouth every 6 (six) hours as needed for severe pain. Must last 28 days 30 tablet 0  . METRONIDAZOLE, TOPICAL, 0.75 % LOTN Apply 0.75 Bottles topically daily.    . Norethindrone-Ethinyl Estradiol-Fe (GENERESS FE) 0.8-25 MG-MCG tablet Chew 1 tablet by mouth daily.    . phentermine 30 MG capsule Take 1 capsule (30 mg  total) by mouth every morning. 30 capsule 1  . pregabalin (LYRICA) 25 MG capsule Take 1 capsule (25 mg total) by mouth 2 (two) times daily. 60 capsule 3  . promethazine (PHENERGAN) 25 MG tablet TAKE 1 TABLET BY MOUTH EVERY 6 HOURS AS NEEDED FOR NAUSEA 30 tablet 3  . SYNTHROID 50 MCG tablet Take 50 mcg by mouth daily.    Marland Kitchen venlafaxine XR (EFFEXOR XR) 75 MG 24 hr capsule 1 capsule in the morning, 2 capsules in the evening 90 capsule 5   No current facility-administered medications on file prior to visit.    ROS:  Out of a complete 14 system review of symptoms, the patient complains only of the  following symptoms, and all other reviewed systems are negative.  Joint pain, back pain, achy muscles, neck pain Headache Depression  There were no vitals taken for this visit.  Physical Exam   Neurologic Exam  Mental status: The patient is oriented x 3.  Cranial nerves: Facial symmetry is present. Speech is normal, no aphasia or dysarthria is noted. Extraocular movements are full. Visual fields are full. She has decreased bilateral corrugate and procerus movement  Motor: The patient has good strength in all 4 extremities.  Sensory examination: Soft touch sensation is symmetric on the face, arms, and legs.  Coordination: The patient has good finger-nose-finger and heel-to-shin bilaterally.  Gait and station: The patient has a normal gait. Tandem gait is normal. Romberg is negative. No drift is seen.  Reflexes: Deep tendon reflexes are symmetric.   Assessment/Plan:  Chronic daily headache, probable migraine, failed multiple preventative medications in the past, including imipramine, Topamax, Lyrica, Effexor,   I have explained to her, and her family about potential long-term side effect of Botox injection, they agreed on the procedure.  BOTOX injection was performed according to protocol by [employer]. 100 units of BOTOX was dissolved into 2 cc NS.   Frontalis 4 sites,  20 units, Temporalis 8 sites,  40 units  Occipitalis 6 sites, 30 units Cervical Paraspinal, 4 sites, 20 units Trapezius, 6 sites, 30 units  Extra 60 units was injected along bilateral cervical paraspinal muscles,  I did not inject bilateral corrugate and procerus to decrease the side effect of muscle weakness  Patient tolerate the injection well. Will return for repeat injection in 3 months.   Levert Feinstein, M.D. Ph.D.  [facility] [address] [place], [place] [address] Phone: [phone] Fax:      [phone]

## 2015-05-06 ENCOUNTER — Ambulatory Visit: Payer: Self-pay | Admitting: Neurology

## 2015-05-25 ENCOUNTER — Other Ambulatory Visit: Payer: Self-pay | Admitting: Neurology

## 2015-05-26 ENCOUNTER — Telehealth: Payer: Self-pay | Admitting: Neurology

## 2015-05-26 MED ORDER — CYCLOBENZAPRINE HCL ER 15 MG PO CP24: 15.0000 mg | ORAL_CAPSULE | Freq: Every day | ORAL | Status: DC

## 2015-05-26 NOTE — Telephone Encounter (Signed)
Patient is calling regarding backing down on medication AMRIX 30 MG 24 hr capsule. Please call to discuss. Thank you.

## 2015-05-26 NOTE — Telephone Encounter (Signed)
I called the patient. We will taper down on Amrix taking 15 mg daily for 3 weeks, then stop. I have called in the prescription.

## 2015-05-26 NOTE — Telephone Encounter (Signed)
I called the patient. She states she is finally off Lyrica. She feels like Botox is helping her migraines and would like to stop taking Amrix. She wanted to make sure this is okay with Dr. Anne Hahn and to find out if she needs to taper off the medication. She states her insurance will not pay for it after this year, which is another reason she would like to go ahead and get off the medication. I advised that I would let Dr. Anne Hahn know and call her back.

## 2015-06-01 ENCOUNTER — Other Ambulatory Visit: Payer: Self-pay | Admitting: Neurology

## 2015-06-07 ENCOUNTER — Other Ambulatory Visit: Payer: Self-pay | Admitting: Neurology

## 2015-06-07 ENCOUNTER — Telehealth: Payer: Self-pay

## 2015-06-07 MED ORDER — HYDROMORPHONE HCL 2 MG PO TABS: 2.0000 mg | ORAL_TABLET | Freq: Four times a day (QID) | ORAL | Status: DC | PRN

## 2015-06-07 NOTE — Telephone Encounter (Signed)
Patient called to request refill of HYDROmorphone (DILAUDID) 2 MG tablet

## 2015-06-07 NOTE — Telephone Encounter (Signed)
Rx is ready for pick up.

## 2015-06-07 NOTE — Telephone Encounter (Signed)
Request entered, forwarded to provider for approval.  

## 2015-06-21 ENCOUNTER — Other Ambulatory Visit: Payer: Self-pay | Admitting: Neurology

## 2015-07-05 ENCOUNTER — Telehealth: Payer: Self-pay | Admitting: Neurology

## 2015-07-05 NOTE — Telephone Encounter (Signed)
Email given to Dr. Anne HahnWillis.

## 2015-07-05 NOTE — Telephone Encounter (Signed)
Patient called and stated she has a e-mail message for Dr. Anne HahnWillis but was unable to send it to him.  The message was sent to me, I printed out and [facility].  Thanks!

## 2015-07-05 NOTE — Telephone Encounter (Signed)
I called the patient. She has gradually been able to get off the Lyrica and Amrix, but she may be having some withdrawal symptoms off the Amrix. She is having some nausea, but she wants to eat as well. She is not sleeping well. I have recommended taking Benadryl night, she may take some Phenergan if needed for the nausea. The patient will contact me if she has any further problems.

## 2015-07-17 ENCOUNTER — Other Ambulatory Visit: Payer: Self-pay | Admitting: Neurology

## 2015-07-18 ENCOUNTER — Other Ambulatory Visit: Payer: Self-pay | Admitting: Neurology

## 2015-07-18 ENCOUNTER — Encounter: Payer: Self-pay | Admitting: Neurology

## 2015-07-18 MED ORDER — CYCLOBENZAPRINE HCL 5 MG PO TABS: ORAL_TABLET | ORAL | Status: DC

## 2015-07-19 ENCOUNTER — Telehealth: Payer: Self-pay | Admitting: Neurology

## 2015-07-19 NOTE — Telephone Encounter (Signed)
Pt called said she sent Dr Anne HahnWillis email 07/18/15 and requested lorazepam .5mg  but what he called in flexeril. She is requesting lorazepam if possible. Please call and advise

## 2015-07-19 NOTE — Telephone Encounter (Signed)
I called the patient.She is to go on a low dose Flexeril, 5 mg, one at night for one week, then one every other night for 2 weeks, then stop. She is having some side effects from Amrix withdrawal.

## 2015-07-31 ENCOUNTER — Other Ambulatory Visit: Payer: Self-pay | Admitting: Neurology

## 2015-08-01 ENCOUNTER — Other Ambulatory Visit: Payer: Self-pay | Admitting: Neurology

## 2015-08-01 MED ORDER — HYDROMORPHONE HCL 2 MG PO TABS: 2.0000 mg | ORAL_TABLET | Freq: Four times a day (QID) | ORAL | Status: DC | PRN

## 2015-08-04 ENCOUNTER — Ambulatory Visit: Payer: Managed Care, Other (non HMO) | Admitting: Neurology

## 2015-08-09 ENCOUNTER — Encounter: Payer: Self-pay | Admitting: Neurology

## 2015-08-09 ENCOUNTER — Other Ambulatory Visit: Payer: Self-pay | Admitting: Neurology

## 2015-08-09 MED ORDER — PHENOBARBITAL 15 MG PO TABS: ORAL_TABLET | ORAL | Status: DC

## 2015-08-11 ENCOUNTER — Telehealth: Payer: Self-pay | Admitting: Neurology

## 2015-08-11 NOTE — Telephone Encounter (Signed)
I called the patient, and I left a message. The patient is having ongoing pain. I will call back tomorrow.

## 2015-08-11 NOTE — Telephone Encounter (Signed)
Patient called to advise she has been emailing back and forth with Dr. Anne HahnWillis regarding backing down off of cyclobenzaprine (FLEXERIL) 5 MG tablet, patient states she is having a really hard time with withdrawal and is in a lot of pain.

## 2015-08-12 MED ORDER — GABAPENTIN 300 MG PO CAPS: 300.0000 mg | ORAL_CAPSULE | Freq: Two times a day (BID) | ORAL | Status: DC

## 2015-08-12 NOTE — Telephone Encounter (Signed)
I called patient. The patient is having increasing headache, but she is also have neuromuscular discomfort in the neck and back. She is having a lot of anxiety issues, she is not sleeping well. I will give a trial on gabapentin taking 300 mg twice daily, if needed the dose can be increased to 3 times a day. The patient will contact me if she is not doing well.

## 2015-08-16 ENCOUNTER — Ambulatory Visit (INDEPENDENT_AMBULATORY_CARE_PROVIDER_SITE_OTHER): Payer: Managed Care, Other (non HMO) | Admitting: Neurology

## 2015-08-16 VITALS — BP 144/95 | HR 103

## 2015-08-16 DIAGNOSIS — G43711 Chronic migraine without aura, intractable, with status migrainosus: Secondary | ICD-10-CM

## 2015-08-16 MED ORDER — DICLOFENAC POTASSIUM(MIGRAINE) 50 MG PO PACK: 50.0000 mg | PACK | ORAL | Status: DC | PRN

## 2015-08-16 NOTE — Progress Notes (Signed)
Reason for visit: Headache  Tracey Ellison is an 24 y.o. female accompanied by his parents, referred by Dr. Anne HahnWillis for Botox injection for her chronic headaches, this is her first injection  She had a history of daily headaches since February 2010, her headache usually stay at the skull base, spreading forward, pressure, constant, 4 out of 10 on daily basis, couple times a week,it would exacerbated to a much more severe headaches, she would have difficulty carrying out her routines, tends to lie down resting, but denies significant light, noise sensitivity, she tends to present to emergency room couple times each year for the treatment of her severe headaches,  Over the years, she was managed by different physician, including [facility], has tried trigger point injection 3 times, without improving her headaches, also tried imipramine, Topamax, Effexor, Lyrica, Amrix, without improving her headaches,  Her severe typical  headache last 1-2 days, respond to Dilaudid treatment, she is now getting Dilaudid prescription   She has tried triptan's treatment in the past, including Imitrex, Maxalt, Relpax, without helping her headaches, cause side effect, such as palpitation,  She is now a Archivistcollege student at [employer]   UPDATE May 05 2015:  She started Botox injection as migraine prevention since November 2015, received it every 3 months, responded very well, with repeat injection, the benefit seems to last longer, she denies significant side effect  Last injection was by Dr. Anne HahnWillis in June 2016, over the past 4 weeks, she has almost daily constant mild pressure headaches, but no typical migraine features.  UPDATE August 16 2015: She responded very well to last injection in May 05 2015, still has daily headache, but much less severe, benefit last about 2 months, over the past few weeks, she has severe daily headaches require frequent Dilaudid use, sometimes 2 tablets  each day, last prescription was 30 tablets in December twelfth 2016, she requires new Dilaudid prescription I have advised her Back daily narcotic use, write cambia   Past Medical History  Diagnosis Date  . Chronic headaches   . Raynauds phenomenon   . Intractable chronic migraine without aura 10/26/2014    Past Surgical History  Procedure Laterality Date  . Wisdom tooth extraction      Family History  Problem Relation Age of Onset  . Hypertension Father   . Leukemia Neg Hx     Hx. of leukemia on father's side of family  . Lung cancer Neg Hx     Hx. of lung cancer on father's side of family  . Breast cancer Neg Hx     Hx. of breast cancer on father's side of family  . Cancer Neg Hx     Hx. of intestinal cancer on father's side of family    Social history:  reports that she has never smoked. She has never used smokeless tobacco. She reports that she does not drink alcohol or use illicit drugs.    Allergies  Allergen Reactions  . Metoclopramide Hcl Anaphylaxis    hives  . Decadron [Dexamethasone]     anxiety    Medications:  Current Outpatient Prescriptions on File Prior to Visit  Medication Sig Dispense Refill  . cyclobenzaprine (AMRIX) 15 MG 24 hr capsule Take 1 capsule (15 mg total) by mouth daily. 21 capsule 0  . cyclobenzaprine (FLEXERIL) 5 MG tablet Take as directed 20 tablet 1  . gabapentin (NEURONTIN) 300 MG capsule Take 1 capsule (300 mg total) by mouth 2 (two) times  daily. 60 capsule 2  . HYDROmorphone (DILAUDID) 2 MG tablet Take 1 tablet (2 mg total) by mouth every 6 (six) hours as needed for severe pain. Must last 28 days 30 tablet 0  . Norethindrone-Ethinyl Estradiol-Fe (GENERESS FE) 0.8-25 MG-MCG tablet Chew 1 tablet by mouth daily.    . OnabotulinumtoxinA (BOTOX IJ) Inject as directed every 3 (three) months.    . phentermine 30 MG capsule TAKE ONE CAPSULE EVERY MORNING 30 capsule 5  . promethazine (PHENERGAN) 25 MG tablet TAKE 1 TABLET BY MOUTH EVERY 6  HOURS AS NEEDED FOR NAUSEA 30 tablet 3  . SYNTHROID 50 MCG tablet Take 50 mcg by mouth daily.    Marland Kitchen venlafaxine XR (EFFEXOR-XR) 75 MG 24 hr capsule 1 CAPSULE IN THE MORNING, 2 CAPSULES IN THE EVENING 90 capsule 6   No current facility-administered medications on file prior to visit.    ROS:  Out of a complete 14 system review of symptoms, the patient complains only of the following symptoms, and all other reviewed systems are negative.  Joint pain, back pain, achy muscles, neck pain Headache Depression  Blood pressure 144/95, pulse 103.  Physical Exam   Neurologic Exam  Mental status: The patient is oriented x 3.  Cranial nerves: Facial symmetry is present. Speech is normal, no aphasia or dysarthria is noted. Extraocular movements are full. Visual fields are full. She has decreased bilateral corrugate and procerus movement  Motor: The patient has good strength in all 4 extremities.  Sensory examination: Soft touch sensation is symmetric on the face, arms, and legs.  Coordination: The patient has good finger-nose-finger and heel-to-shin bilaterally.  Gait and station: The patient has a normal gait. Tandem gait is normal. Romberg is negative. No drift is seen.  Reflexes: Deep tendon reflexes are symmetric.   Assessment/Plan:  Chronic daily headache, probable migraine, failed multiple preventative medications in the past, including imipramine, Topamax, Lyrica, Effexor,   I have explained to her, and her family about potential long-term side effect of Botox injection, they agreed on the procedure.  BOTOX injection was performed according to protocol by [employer]. 100 units of BOTOX was dissolved into 2 cc NS.   Frontalis 4 sites,  20 units, Temporalis 8 sites,  40 units  Occipitalis 6 sites, 30 units Cervical Paraspinal, 4 sites, 20 units Trapezius, 6 sites, 30 units  Extra 60 units was injected along bilateral cervical paraspinal muscles,  I did not inject bilateral  corrugate and procerus to decrease the side effect of muscle weakness  Patient tolerate the injection well. Will return for repeat injection in 3 months.  She complains of frequent headaches after 2 months of previous Botox injection, has been taking frequent Dilaudid, require refill, chart reviewed, last refill was December twelfth 2016, I have advised patient cut back  narcotic medication use, I have written Cambia as needed.   Levert Feinstein, M.D. Ph.D.  [facility] [address] [place], [place] [address] Phone: [phone] Fax:      [phone]

## 2015-08-23 ENCOUNTER — Encounter: Payer: Self-pay | Admitting: Neurology

## 2015-08-23 ENCOUNTER — Telehealth: Payer: Self-pay | Admitting: Neurology

## 2015-08-23 NOTE — Telephone Encounter (Signed)
Copies of x-rays of the low back and cervical spine done on the fourth of general 2016 are on my desk, no report is with the images, but the studies appear to be normal by my review.

## 2015-08-23 NOTE — Telephone Encounter (Signed)
Patient called and stated that she needed to reschedule apt. I cancelled her current apt but could not find anything for the next few weeks. She needs to be scheduled after March 21st. Could you work her in for me Earney NavyKelby?

## 2015-08-24 ENCOUNTER — Ambulatory Visit (INDEPENDENT_AMBULATORY_CARE_PROVIDER_SITE_OTHER): Payer: Self-pay | Admitting: Neurology

## 2015-08-24 ENCOUNTER — Encounter: Payer: Self-pay | Admitting: Neurology

## 2015-08-24 VITALS — BP 138/93 | HR 105 | Ht 67.0 in | Wt 183.5 lb

## 2015-08-24 DIAGNOSIS — G441 Vascular headache, not elsewhere classified: Secondary | ICD-10-CM

## 2015-08-24 DIAGNOSIS — F410 Panic disorder [episodic paroxysmal anxiety] without agoraphobia: Secondary | ICD-10-CM

## 2015-08-24 MED ORDER — ALPRAZOLAM 0.25 MG PO TABS: 0.2500 mg | ORAL_TABLET | Freq: Every evening | ORAL | Status: DC | PRN

## 2015-08-24 MED ORDER — HYDROMORPHONE HCL 2 MG PO TABS: 2.0000 mg | ORAL_TABLET | Freq: Four times a day (QID) | ORAL | Status: DC | PRN

## 2015-08-24 MED ORDER — GABAPENTIN 300 MG PO CAPS: ORAL_CAPSULE | ORAL | Status: DC

## 2015-08-24 NOTE — Patient Instructions (Signed)
    Go up on the gabapentin to 300 mg in the morning and 600 mg in the evening.

## 2015-08-24 NOTE — Progress Notes (Signed)
Reason for visit: Headache  Tracey BreslowLindsey M Ellison is an 25 y.o. female  History of present illness:  Tracey Ellison is a 25 year old right-handed white female with a history of intractable migraine headache. The patient has had worsening of the headache with a gradual withdrawal coming off of the Amrix medication. The patient has had severe worsening of the headache as well as episodes of anxiety attacks, and panic attacks. The patient will have the worst episodes in the middle the night. The patient had one last evening. The patient is doing better on gabapentin, she was just recently started on 300 mg twice daily. She is also on Effexor. She is now on low-dose Flexeril, she is taking 5 mg every other day, she will stop the medication next week. The headaches remain daily in nature, they are disabling almost half of the days. She recently had a Botox injection, she indicates that she gets benefit with the Botox for about 5-6 weeks before the headaches returned to the usual frequency and severity. She has graduated from college, she will be looking for a job in the near future.  Past Medical History  Diagnosis Date  . Chronic headaches   . Raynauds phenomenon   . Intractable chronic migraine without aura 10/26/2014    Past Surgical History  Procedure Laterality Date  . Wisdom tooth extraction      Family History  Problem Relation Age of Onset  . Hypertension Father   . Leukemia Neg Hx     Hx. of leukemia on father's side of family  . Lung cancer Neg Hx     Hx. of lung cancer on father's side of family  . Breast cancer Neg Hx     Hx. of breast cancer on father's side of family  . Cancer Neg Hx     Hx. of intestinal cancer on father's side of family    Social history:  reports that she has never smoked. She has never used smokeless tobacco. She reports that she does not drink alcohol or use illicit drugs.    Allergies  Allergen Reactions  . Metoclopramide Hcl Anaphylaxis    hives  .  Decadron [Dexamethasone]     anxiety    Medications:  Prior to Admission medications   Medication Sig Start Date End Date Taking? Authorizing Provider  cyclobenzaprine (FLEXERIL) 5 MG tablet Take as directed 07/18/15  Yes York Spanielharles K Dayven Linsley, MD  Diclofenac Potassium (CAMBIA) 50 MG PACK Take 50 mg by mouth as needed. 08/16/15  Yes Levert FeinsteinYijun Yan, MD  gabapentin (NEURONTIN) 300 MG capsule One capsule in the morning and two in the evening 08/24/15  Yes York Spanielharles K Lela Gell, MD  HYDROmorphone (DILAUDID) 2 MG tablet Take 1 tablet (2 mg total) by mouth every 6 (six) hours as needed for severe pain. Must last 28 days 08/24/15  Yes York Spanielharles K Wynonia Medero, MD  Norethindrone-Ethinyl Estradiol-Fe (GENERESS FE) 0.8-25 MG-MCG tablet Chew 1 tablet by mouth daily.   Yes Historical Provider, MD  OnabotulinumtoxinA (BOTOX IJ) Inject as directed every 3 (three) months.   Yes Historical Provider, MD  promethazine (PHENERGAN) 25 MG tablet TAKE 1 TABLET BY MOUTH EVERY 6 HOURS AS NEEDED FOR NAUSEA 07/13/14  Yes York Spanielharles K Emmaline Wahba, MD  SYNTHROID 50 MCG tablet Take 50 mcg by mouth daily. 10/01/13  Yes Historical Provider, MD  venlafaxine XR (EFFEXOR-XR) 75 MG 24 hr capsule 1 CAPSULE IN THE MORNING, 2 CAPSULES IN THE EVENING 06/01/15  Yes York Spanielharles K Pasha Gadison, MD  ALPRAZolam (  XANAX) 0.25 MG tablet Take 1 tablet (0.25 mg total) by mouth at bedtime as needed for anxiety. 08/24/15   York Spaniel, MD    ROS:  Out of a complete 14 system review of symptoms, the patient complains only of the following symptoms, and all other reviewed systems are negative.  Restless legs, insomnia, frequent waking Joint pain, back pain, achy muscles, neck pain Headache Depression, anxiety   Blood pressure 138/93, pulse 105, height 5\' 7"  (1.702 m), weight 183 lb 8 oz (83.235 kg).  Physical Exam  General: The patient is alert and cooperative at the time of the examination.  Neuromuscular: Range of movement of the cervical spine is full.  Skin: No  significant peripheral edema is noted.   Neurologic Exam  Mental status: The patient is alert and oriented x 3 at the time of the examination. The patient has apparent normal recent and remote memory, with an apparently normal attention span and concentration ability.   Cranial nerves: Facial symmetry is present. Speech is normal, no aphasia or dysarthria is noted. Extraocular movements are full. Visual fields are full.  Motor: The patient has good strength in all 4 extremities.  Sensory examination: Soft touch sensation is symmetric on the face, arms, and legs.  Coordination: The patient has good finger-nose-finger and heel-to-shin bilaterally.  Gait and station: The patient has a normal gait. Tandem gait is normal. Romberg is negative. No drift is seen.  Reflexes: Deep tendon reflexes are symmetric.   Assessment/Plan:  1. Intractable migraine headache   2. Anxiety episodes, panic attacks   The patient has done some better with the gabapentin, we will increase the dose taking 300 mg the morning, 600 mg in the evening. A one-time prescription was given for alprazolam 0.25 mg to take if needed for the panic attacks. The patient was given a prescription for her Dilaudid, and she will follow-up for her next Botox injection in March 2016. She will contact me if she is not doing well.   Marlan Palau MD 08/24/2015 4:54 PM  [facility] [address] [place], [place] [address]  Phone [phone] Fax [phone]

## 2015-08-24 NOTE — Telephone Encounter (Signed)
Appointment r/s to 11/11/15 at 8 AM.

## 2015-08-25 ENCOUNTER — Telehealth: Payer: Self-pay | Admitting: Neurology

## 2015-08-25 NOTE — Telephone Encounter (Signed)
I called back and spoke with pharmacist Genelle BalBrett.  Says patient last filled Dilaudid on 12/14.  They would like to know if they should fill new Rx or place it on hold at this time.

## 2015-08-25 NOTE — Telephone Encounter (Signed)
[facility] called and says pt has previous rx for HYDROmorphone (DILAUDID) 2 MG tablet that stated it should last 28 days. Pt has new rx for same rx that was wrote on 1/4. It has not been 28 days. Can they fill the new rx? Please call [phone]

## 2015-08-25 NOTE — Telephone Encounter (Signed)
I called pharmacy, left a message. Okay to refill the Dilaudid one week early.

## 2015-08-29 ENCOUNTER — Encounter: Payer: Self-pay | Admitting: Neurology

## 2015-09-14 ENCOUNTER — Ambulatory Visit: Payer: Managed Care, Other (non HMO) | Admitting: Neurology

## 2015-09-16 ENCOUNTER — Encounter: Payer: Self-pay | Admitting: Neurology

## 2015-09-28 ENCOUNTER — Encounter: Payer: Self-pay | Admitting: Neurology

## 2015-09-29 ENCOUNTER — Other Ambulatory Visit: Payer: Self-pay

## 2015-09-29 ENCOUNTER — Telehealth: Payer: Self-pay

## 2015-09-29 ENCOUNTER — Other Ambulatory Visit: Payer: Self-pay | Admitting: Neurology

## 2015-09-29 MED ORDER — HYDROMORPHONE HCL 2 MG PO TABS: 2.0000 mg | ORAL_TABLET | Freq: Four times a day (QID) | ORAL | Status: DC | PRN

## 2015-09-29 NOTE — Telephone Encounter (Signed)
Rx ready for pick up. 

## 2015-10-05 ENCOUNTER — Encounter: Payer: Self-pay | Admitting: Neurology

## 2015-10-05 ENCOUNTER — Telehealth: Payer: Self-pay | Admitting: Neurology

## 2015-10-05 MED ORDER — TRAZODONE HCL 50 MG PO TABS: 50.0000 mg | ORAL_TABLET | Freq: Every day | ORAL | Status: DC

## 2015-10-05 MED ORDER — LORAZEPAM 0.5 MG PO TABS: 0.5000 mg | ORAL_TABLET | Freq: Three times a day (TID) | ORAL | Status: DC

## 2015-10-05 NOTE — Telephone Encounter (Signed)
I called patient. She is still having issues with sleeping at night. She indicates that alprazolam does not work well, lorazepam worked better for her anxiety. I'll switch her to raise up and, and give her trazodone to take at night if needed for sleep. She is also on Effexor.

## 2015-10-06 ENCOUNTER — Encounter: Payer: Self-pay | Admitting: Neurology

## 2015-10-12 ENCOUNTER — Other Ambulatory Visit: Payer: Self-pay | Admitting: Neurology

## 2015-10-17 ENCOUNTER — Encounter: Payer: Self-pay | Admitting: Neurology

## 2015-10-18 ENCOUNTER — Telehealth: Payer: Self-pay | Admitting: *Deleted

## 2015-10-18 NOTE — Telephone Encounter (Signed)
-----   Message from York Spaniel, MD sent at 10/17/2015  5:29 PM EST ----- Need a RV sometime in the next week or 2 thanks.

## 2015-10-18 NOTE — Telephone Encounter (Signed)
Left message for a return call.  Dr. Anne Hahn would like to see her in the office in the next 1-2 weeks.

## 2015-10-18 NOTE — Telephone Encounter (Signed)
Patient returned Michelle's call. °

## 2015-10-18 NOTE — Telephone Encounter (Signed)
Spoke to patient - scheduled for 10/26/15.

## 2015-10-26 ENCOUNTER — Encounter: Payer: Self-pay | Admitting: Neurology

## 2015-10-26 ENCOUNTER — Telehealth: Payer: Self-pay | Admitting: Neurology

## 2015-10-26 ENCOUNTER — Ambulatory Visit (INDEPENDENT_AMBULATORY_CARE_PROVIDER_SITE_OTHER): Payer: Managed Care, Other (non HMO) | Admitting: Neurology

## 2015-10-26 VITALS — BP 130/84 | HR 86 | Resp 20 | Ht 67.0 in | Wt 180.0 lb

## 2015-10-26 DIAGNOSIS — F410 Panic disorder [episodic paroxysmal anxiety] without agoraphobia: Secondary | ICD-10-CM

## 2015-10-26 DIAGNOSIS — G43711 Chronic migraine without aura, intractable, with status migrainosus: Secondary | ICD-10-CM | POA: Diagnosis not present

## 2015-10-26 MED ORDER — AMITRIPTYLINE HCL 25 MG PO TABS: 50.0000 mg | ORAL_TABLET | Freq: Every day | ORAL | Status: DC

## 2015-10-26 MED ORDER — HYDROMORPHONE HCL 4 MG PO TABS: 4.0000 mg | ORAL_TABLET | Freq: Four times a day (QID) | ORAL | Status: DC | PRN

## 2015-10-26 NOTE — Progress Notes (Signed)
Reason for visit: Headache  Tracey Ellison is an 25 y.o. female  History of present illness:  Tracey Ellison is a 25 year old right-handed white female with a history of an anxiety disorder, and chronic daily headaches. The patient has gained benefit with Amrix initially, but when she came off the medication she has had significant issues with insomnia. The patient will get about 4 hours of sleep at night, she feels tired during the day. Occasionally, she may take an Ambien from her mother, and this helps her sleep but she does not feel rested the next day. She does not believe that the headache itself is keeping her awake. The patient is having some increased problems with headaches off of the Amrix, she has a lot of neck discomfort and stiffness. The patient is trying to exercise on a regular basis. She is on gabapentin taking 300 mg in the morning and 600 mg in the evening. She has lorazepam to take for her anxiety issues. She is on Effexor. In the past, she has been on Lyrica, imipramine, and Topamax. She returns this office today for an evaluation. She currently is working.  Past Medical History  Diagnosis Date  . Chronic headaches   . Raynauds phenomenon   . Intractable chronic migraine without aura 10/26/2014    Past Surgical History  Procedure Laterality Date  . Wisdom tooth extraction      Family History  Problem Relation Age of Onset  . Hypertension Father   . Leukemia Neg Hx     Hx. of leukemia on father's side of family  . Lung cancer Neg Hx     Hx. of lung cancer on father's side of family  . Breast cancer Neg Hx     Hx. of breast cancer on father's side of family  . Cancer Neg Hx     Hx. of intestinal cancer on father's side of family    Social history:  reports that she has never smoked. She has never used smokeless tobacco. She reports that she does not drink alcohol or use illicit drugs.    Allergies  Allergen Reactions  . Metoclopramide Hcl Anaphylaxis   hives  . Decadron [Dexamethasone]     anxiety    Medications:  Prior to Admission medications   Medication Sig Start Date End Date Taking? Authorizing Provider  ALPRAZolam Prudy Feeler) 0.25 MG tablet  08/24/15  Yes Historical Provider, MD  Diclofenac Potassium (CAMBIA) 50 MG PACK Take 50 mg by mouth as needed. 08/16/15  Yes Levert Feinstein, MD  gabapentin (NEURONTIN) 300 MG capsule One capsule in the morning and two in the evening 08/24/15  Yes York Spaniel, MD  HYDROmorphone (DILAUDID) 2 MG tablet Take 1 tablet (2 mg total) by mouth every 6 (six) hours as needed for severe pain. Must last 28 days 09/29/15  Yes York Spaniel, MD  LORazepam (ATIVAN) 0.5 MG tablet Take 1 tablet (0.5 mg total) by mouth every 8 (eight) hours. 10/05/15  Yes York Spaniel, MD  Norethindrone-Ethinyl Estradiol-Fe (GENERESS FE) 0.8-25 MG-MCG tablet Chew 1 tablet by mouth daily.   Yes Historical Provider, MD  OnabotulinumtoxinA (BOTOX IJ) Inject as directed every 3 (three) months.   Yes Historical Provider, MD  promethazine (PHENERGAN) 25 MG tablet TAKE 1 TABLET BY MOUTH EVERY 6 HOURS AS NEEDED FOR NAUSEA 07/13/14  Yes York Spaniel, MD  SYNTHROID 50 MCG tablet Take 50 mcg by mouth daily. 10/01/13  Yes Historical Provider, MD  traZODone (DESYREL)  50 MG tablet Take 1 tablet (50 mg total) by mouth at bedtime. 10/05/15  Yes York Spanielharles K Malacai Grantz, MD  venlafaxine XR (EFFEXOR-XR) 75 MG 24 hr capsule 1 CAPSULE IN THE MORNING, 2 CAPSULES IN THE EVENING 06/01/15  Yes York Spanielharles K Xian Apostol, MD    ROS:  Out of a complete 14 system review of symptoms, the patient complains only of the following symptoms, and all other reviewed systems are negative.  Insomnia Joint pain, achy muscles, neck pain Headache Agitation, depression, anxiety  Blood pressure 130/84, pulse 86, resp. rate 20, height 5\' 7"  (1.702 m), weight 180 lb (81.647 kg).  Physical Exam  General: The patient is alert and cooperative at the time of the  examination.  Neuromuscular: Range of movement of the cervical spine is full.  Skin: No significant peripheral edema is noted.   Neurologic Exam  Mental status: The patient is alert and oriented x 3 at the time of the examination. The patient has apparent normal recent and remote memory, with an apparently normal attention span and concentration ability.   Cranial nerves: Facial symmetry is present. Speech is normal, no aphasia or dysarthria is noted. Extraocular movements are full. Visual fields are full.  Motor: The patient has good strength in all 4 extremities.  Sensory examination: Soft touch sensation is symmetric on the face, arms, and legs.  Coordination: The patient has good finger-nose-finger and heel-to-shin bilaterally.  Gait and station: The patient has a normal gait. Tandem gait is normal. Romberg is negative. No drift is seen.  Reflexes: Deep tendon reflexes are symmetric.   Assessment/Plan:  1. Chronic daily headache  2. Anxiety disorder  3. Insomnia  The patient is not doing well with her ability to sleep, and her headache subsequently has increased. The patient has had increasing problems since coming off of the Amrix. The patient was tried on trazodone without benefit. At this point, we will taper her off of the Effexor, and go on amitriptyline at night. The patient was given a prescription for Dilaudid today, 4 mg tablets with 30 tablets given. She will follow-up for her Botox injection in the next several weeks. She indicates that the Botox is less effective since she has come off of the Amrix. She wishes to have a second opinion [facility], I have referred her to Dr. Ezequiel EssexGable.  Marlan Palau. Keith Hero Kulish MD 10/26/2015 8:39 PM  [facility] [address] [place], [place] [id number]  Phone [phone] Fax [phone]

## 2015-10-26 NOTE — Patient Instructions (Addendum)
We will start amitriptyline 25 mg at night for 2 weeks, then take 50 mg at night. When you start the amitriptyline, cut back to one of the Effexor tablets for 2 weeks, then stop.   Migraine Headache A migraine headache is an intense, throbbing pain on one or both sides of your head. A migraine can last for 30 minutes to several hours. CAUSES  The exact cause of a migraine headache is not always known. However, a migraine may be caused when nerves in the brain become irritated and release chemicals that cause inflammation. This causes pain. Certain things may also trigger migraines, such as:  Alcohol.  Smoking.  Stress.  Menstruation.  Aged cheeses.  Foods or drinks that contain nitrates, glutamate, aspartame, or tyramine.  Lack of sleep.  Chocolate.  Caffeine.  Hunger.  Physical exertion.  Fatigue.  Medicines used to treat chest pain (nitroglycerine), birth control pills, estrogen, and some blood pressure medicines. SIGNS AND SYMPTOMS  Pain on one or both sides of your head.  Pulsating or throbbing pain.  Severe pain that prevents daily activities.  Pain that is aggravated by any physical activity.  Nausea, vomiting, or both.  Dizziness.  Pain with exposure to bright lights, loud noises, or activity.  General sensitivity to bright lights, loud noises, or smells. Before you get a migraine, you may get warning signs that a migraine is coming (aura). An aura may include:  Seeing flashing lights.  Seeing bright spots, halos, or zigzag lines.  Having tunnel vision or blurred vision.  Having feelings of numbness or tingling.  Having trouble talking.  Having muscle weakness. DIAGNOSIS  A migraine headache is often diagnosed based on:  Symptoms.  Physical exam.  A CT scan or MRI of your head. These imaging tests cannot diagnose migraines, but they can help rule out other causes of headaches. TREATMENT Medicines may be given for pain and nausea.  Medicines can also be given to help prevent recurrent migraines.  HOME CARE INSTRUCTIONS  Only take over-the-counter or prescription medicines for pain or discomfort as directed by your health care provider. The use of long-term narcotics is not recommended.  Lie down in a dark, quiet room when you have a migraine.  Keep a journal to find out what may trigger your migraine headaches. For example, write down:  What you eat and drink.  How much sleep you get.  Any change to your diet or medicines.  Limit alcohol consumption.  Quit smoking if you smoke.  Get 7-9 hours of sleep, or as recommended by your health care provider.  Limit stress.  Keep lights dim if bright lights bother you and make your migraines worse. SEEK IMMEDIATE MEDICAL CARE IF:   Your migraine becomes severe.  You have a fever.  You have a stiff neck.  You have vision loss.  You have muscular weakness or loss of muscle control.  You start losing your balance or have trouble walking.  You feel faint or pass out.  You have severe symptoms that are different from your first symptoms. MAKE SURE YOU:   Understand these instructions.  Will watch your condition.  Will get help right away if you are not doing well or get worse.   This information is not intended to replace advice given to you by your health care provider. Make sure you discuss any questions you have with your health care provider.   Document Released: 08/06/2005 Document Revised: 08/27/2014 Document Reviewed: 04/13/2013 Elsevier Interactive Patient Education  2016 [employer].

## 2015-10-26 NOTE — Telephone Encounter (Signed)
Patient called 8:00am to advise she will be here in 5 minutes for 8:00am appt this morning with Dr. Anne HahnWillis.

## 2015-11-04 ENCOUNTER — Ambulatory Visit: Payer: Managed Care, Other (non HMO) | Admitting: Neurology

## 2015-11-08 ENCOUNTER — Encounter: Payer: Self-pay | Admitting: Neurology

## 2015-11-09 ENCOUNTER — Telehealth: Payer: Self-pay | Admitting: Neurology

## 2015-11-09 MED ORDER — NORTRIPTYLINE HCL 10 MG PO CAPS: 10.0000 mg | ORAL_CAPSULE | Freq: Every day | ORAL | Status: DC

## 2015-11-09 NOTE — Telephone Encounter (Signed)
The patient is on 25 mg of amitriptyline, she is not tolerating as she is feeling "out of her body" and she is having vivid dreams. She will stop the medication, I will try nortriptyline in very low dose, 10 mg. She will be seen for Botox in the near future.

## 2015-11-11 ENCOUNTER — Encounter: Payer: Self-pay | Admitting: Neurology

## 2015-11-11 ENCOUNTER — Ambulatory Visit (INDEPENDENT_AMBULATORY_CARE_PROVIDER_SITE_OTHER): Payer: Managed Care, Other (non HMO) | Admitting: Neurology

## 2015-11-11 VITALS — BP 126/82 | HR 99 | Ht 67.0 in | Wt 181.0 lb

## 2015-11-11 DIAGNOSIS — G43711 Chronic migraine without aura, intractable, with status migrainosus: Secondary | ICD-10-CM | POA: Diagnosis not present

## 2015-11-11 MED ORDER — CYCLOBENZAPRINE HCL ER 15 MG PO CP24: 15.0000 mg | ORAL_CAPSULE | Freq: Every day | ORAL | Status: DC

## 2015-11-11 NOTE — Procedures (Signed)
     BOTOX PROCEDURE NOTE FOR MIGRAINE HEADACHE   HISTORY: Tracey Ellison is a 25 year old patient with a history of intractable migraine headache. The patient is having headaches daily nature, she did not tolerate amitriptyline. She is to be [facility] at [facility] in August 2017 for a second opinion regarding treatment of her headache.   Description of procedure:  The patient was placed in a sitting position. The standard protocol was used for Botox as follows, with 5 units of Botox injected at each site:   -Procerus muscle, midline injection  -Corrugator muscle, bilateral injection  -Frontalis muscle, bilateral injection, with 2 sites each side, medial injection was performed in the upper one third of the frontalis muscle, in the region vertical from the medial inferior edge of the superior orbital rim. The lateral injection was again in the upper one third of the forehead vertically above the lateral limbus of the cornea, 1.5 cm lateral to the medial injection site.  -Temporalis muscle injection, 4 sites, bilaterally. The first injection was 3 cm above the tragus of the ear, second injection site was 1.5 cm to 3 cm up from the first injection site in line with the tragus of the ear. The third injection site was 1.5-3 cm forward between the first 2 injection sites. The fourth injection site was 1.5 cm posterior to the second injection site.  -Occipitalis muscle injection, 3 sites, bilaterally. The first injection was done one half way between the occipital protuberance and the tip of the mastoid process behind the ear. The second injection site was done lateral and superior to the first, 1 fingerbreadth from the first injection. The third injection site was 1 fingerbreadth superiorly and medially from the first injection site.  -Cervical paraspinal muscle injection, 2 sites, bilateral, the first injection site was 1 cm from the midline of the cervical spine, 3 cm inferior to the lower border of the  occipital protuberance. The second injection site was 1.5 cm superiorly and laterally to the first injection site.  -Trapezius muscle injection was performed at 3 sites, bilaterally. The first injection site was in the upper trapezius muscle halfway between the inflection point of the neck, and the acromion. The second injection site was one half way between the acromion and the first injection site. The third injection was done between the first injection site and the inflection point of the neck.   A 200 unit bottle of Botox was used, 155 units were injected, the rest of the Botox was wasted. The patient tolerated the procedure well, there were no complications of the above procedure.  Botox NDC [id number] Lot number [id number] Expiration date September 2019

## 2015-11-11 NOTE — Progress Notes (Signed)
Please refer to Botox procedure note. 

## 2015-11-13 ENCOUNTER — Other Ambulatory Visit: Payer: Self-pay | Admitting: Neurology

## 2015-11-13 ENCOUNTER — Encounter: Payer: Self-pay | Admitting: Neurology

## 2015-11-18 ENCOUNTER — Telehealth: Payer: Self-pay | Admitting: *Deleted

## 2015-11-18 MED ORDER — CYCLOBENZAPRINE HCL 7.5 MG PO TABS: 15.0000 mg | ORAL_TABLET | Freq: Every day | ORAL | Status: DC

## 2015-11-18 NOTE — Telephone Encounter (Signed)
Per Rosann Auerbachigna, generic Amrix 15 mg is no longer available, but generic Amrix 7.5mg  is available and covered by insurance.  Will ask Dr. Anne HahnWillis to adjust rx./fim

## 2015-11-18 NOTE — Telephone Encounter (Signed)
Okay to use the 7.5 mg tablet. I will call in a prescription.

## 2015-11-21 ENCOUNTER — Telehealth: Payer: Self-pay | Admitting: Neurology

## 2015-11-21 ENCOUNTER — Encounter: Payer: Self-pay | Admitting: Neurology

## 2015-11-21 MED ORDER — CYCLOBENZAPRINE HCL ER 15 MG PO CP24: 15.0000 mg | ORAL_CAPSULE | Freq: Every day | ORAL | Status: DC | PRN

## 2015-11-21 NOTE — Telephone Encounter (Signed)
The patient contacted me through my chart. In the past, she has not done well with the short-acting cyclobenzaprine preparations. We will try to get the Amrix covered, I have written a letter.

## 2015-11-24 ENCOUNTER — Telehealth: Payer: Self-pay | Admitting: *Deleted

## 2015-11-24 NOTE — Telephone Encounter (Signed)
I called pt.  Relayed that have to redo PA for amrix.  Faxed to [phone] (fax confirmation received).  I also faxed Dr. Clarisa KindredWillis's letter regarding this as well.

## 2015-12-01 ENCOUNTER — Telehealth: Payer: Self-pay | Admitting: Neurology

## 2015-12-01 MED ORDER — ESZOPICLONE 2 MG PO TABS: 2.0000 mg | ORAL_TABLET | Freq: Every evening | ORAL | Status: DC | PRN

## 2015-12-01 NOTE — Telephone Encounter (Signed)
Lunesta rx faxed and [facility] at [phone].

## 2015-12-01 NOTE — Telephone Encounter (Signed)
I called the patient. The Amrix was denied even after an appeal letter. Fourthly, the patient is feeling better with the headaches following Botox, but she is still having insomnia. I will call in a prescription for Lunesta for sleep.

## 2016-01-05 ENCOUNTER — Encounter: Payer: Self-pay | Admitting: Neurology

## 2016-01-05 ENCOUNTER — Other Ambulatory Visit: Payer: Self-pay | Admitting: Neurology

## 2016-01-05 MED ORDER — HYDROMORPHONE HCL 4 MG PO TABS: 4.0000 mg | ORAL_TABLET | Freq: Four times a day (QID) | ORAL | Status: DC | PRN

## 2016-02-15 ENCOUNTER — Other Ambulatory Visit: Payer: Self-pay | Admitting: Neurology

## 2016-02-15 ENCOUNTER — Telehealth: Payer: Self-pay | Admitting: Neurology

## 2016-02-15 ENCOUNTER — Ambulatory Visit: Payer: Managed Care, Other (non HMO) | Admitting: Neurology

## 2016-02-15 MED ORDER — HYDROMORPHONE HCL 4 MG PO TABS: 4.0000 mg | ORAL_TABLET | Freq: Four times a day (QID) | ORAL | Status: DC | PRN

## 2016-02-15 NOTE — Telephone Encounter (Signed)
Patient called regarding BOTOX appointment today 12pm with Dr. Anne HahnWillis. States she received phone call from pharmacy, there was a mistake on their end and Botox medication hasn't even shipped yet. Biagio BorgSkyped Danielle, per Duwayne Heckanielle, she will call patient back to reschedule. Please call [phone].

## 2016-02-15 NOTE — Telephone Encounter (Signed)
Pt called back, please call asap. Thanks

## 2016-02-15 NOTE — Progress Notes (Signed)
Rx printed, signed, up front for pick-up. 

## 2016-02-16 NOTE — Telephone Encounter (Signed)
Pt called back. Tracey HeckDanielle was skyped, said she will need to check with Annabelle HarmanDana to see if medication has been rec'd. She will call pt back

## 2016-02-20 ENCOUNTER — Encounter: Payer: Self-pay | Admitting: Neurology

## 2016-02-23 NOTE — Telephone Encounter (Signed)
Pt emailed back to follow-up on Botox. Checked w/ Annabelle Harmanana and Botox arrived today. Will e-mail pt back and offer appt tomorrow at noon for injections.

## 2016-02-24 ENCOUNTER — Ambulatory Visit (INDEPENDENT_AMBULATORY_CARE_PROVIDER_SITE_OTHER): Payer: Managed Care, Other (non HMO) | Admitting: Neurology

## 2016-02-24 ENCOUNTER — Encounter: Payer: Self-pay | Admitting: Neurology

## 2016-02-24 VITALS — BP 141/103 | HR 99 | Ht 67.0 in | Wt 178.5 lb

## 2016-02-24 DIAGNOSIS — G43711 Chronic migraine without aura, intractable, with status migrainosus: Secondary | ICD-10-CM | POA: Diagnosis not present

## 2016-02-24 MED ORDER — TRAZODONE HCL 50 MG PO TABS: 50.0000 mg | ORAL_TABLET | Freq: Every day | ORAL | Status: DC

## 2016-02-24 NOTE — Progress Notes (Signed)
Botox 100 units[facility]vial x 2 [facility] [phone] [id number]  Lot [id number] Exp Jan 2020   The patient has noted some increase in blood pressure over the last several weeks. The patient is on Effexor taking 75 mg twice daily. The patient will drop the dose to 1 a day and continue to monitor the blood pressure, if the blood pressure does not drop, we may have to come off of the Effexor. The patient is not sleeping well at night, I will add trazodone at night 50 mg. The patient is not getting good benefit with Lunesta.

## 2016-02-24 NOTE — Procedures (Signed)
     BOTOX PROCEDURE NOTE FOR MIGRAINE HEADACHE   HISTORY: Tracey CoombeLindsey Mazzuca is a 25 year old patient with a history of intractable migraine headaches. The patient has gained benefit with the Botox, she has had a decrease in frequency and severity of her headaches for at least 2 months after the last Botox injection, within the last 3-4 weeks, the headaches have become more frequent, are now back to been daily in nature. The patient continues to have ongoing issues with sleeping at night. She has chronic insomnia. She comes in today for a Botox injection.   Description of procedure:  The patient was placed in a sitting position. The standard protocol was used for Botox as follows, with 5 units of Botox injected at each site:   -Procerus muscle, midline injection  -Corrugator muscle, bilateral injection  -Frontalis muscle, bilateral injection, with 2 sites each side, medial injection was performed in the upper one third of the frontalis muscle, in the region vertical from the medial inferior edge of the superior orbital rim. The lateral injection was again in the upper one third of the forehead vertically above the lateral limbus of the cornea, 1.5 cm lateral to the medial injection site.  -Temporalis muscle injection, 4 sites, bilaterally. The first injection was 3 cm above the tragus of the ear, second injection site was 1.5 cm to 3 cm up from the first injection site in line with the tragus of the ear. The third injection site was 1.5-3 cm forward between the first 2 injection sites. The fourth injection site was 1.5 cm posterior to the second injection site.  -Occipitalis muscle injection, 3 sites, bilaterally. The first injection was done one half way between the occipital protuberance and the tip of the mastoid process behind the ear. The second injection site was done lateral and superior to the first, 1 fingerbreadth from the first injection. The third injection site was 1 fingerbreadth  superiorly and medially from the first injection site.  -Cervical paraspinal muscle injection, 2 sites, bilateral, the first injection site was 1 cm from the midline of the cervical spine, 3 cm inferior to the lower border of the occipital protuberance. The second injection site was 1.5 cm superiorly and laterally to the first injection site.  -Trapezius muscle injection was performed at 3 sites, bilaterally. The first injection site was in the upper trapezius muscle halfway between the inflection point of the neck, and the acromion. The second injection site was one half way between the acromion and the first injection site. The third injection was done between the first injection site and the inflection point of the neck.   A 200 unit bottle of Botox was used, 155 units were injected, the rest of the Botox was wasted. The patient tolerated the procedure well, there were no complications of the above procedure.  Botox NDC [id number] Lot number [id number] Expiration date January 2020

## 2016-03-30 ENCOUNTER — Encounter: Payer: Self-pay | Admitting: Neurology

## 2016-03-31 ENCOUNTER — Other Ambulatory Visit: Payer: Self-pay | Admitting: Neurology

## 2016-04-02 ENCOUNTER — Telehealth: Payer: Self-pay | Admitting: Neurology

## 2016-04-02 DIAGNOSIS — M542 Cervicalgia: Secondary | ICD-10-CM

## 2016-04-02 DIAGNOSIS — G43709 Chronic migraine without aura, not intractable, without status migrainosus: Secondary | ICD-10-CM

## 2016-04-02 DIAGNOSIS — IMO0002 Reserved for concepts with insufficient information to code with codable children: Secondary | ICD-10-CM

## 2016-04-02 MED ORDER — HYDROMORPHONE HCL 4 MG PO TABS: 4.0000 mg | ORAL_TABLET | Freq: Four times a day (QID) | ORAL | 0 refills | Status: DC | PRN

## 2016-04-02 NOTE — Telephone Encounter (Addendum)
Mother Sallyanne KusterKristy Wiebelhaus called regarding referral from Dr. Eudelia [facility]ollins/[facility] (where patient went for [facility]nd opinion) for Dr. Anne HahnWillis to do another MRI and to refer to [facility]. States Dr. Thomasena Edisollins sent referral to our office Tuesday August 8th. States husband faxed to our office on Thursday night after Mom called to see if we had received referral and was told we didn't have referral. Mom also is checking status of Rx refill for HYDROmorphone (DILAUDID) 4 MG tablet.

## 2016-04-02 NOTE — Telephone Encounter (Signed)
Returned call to pt's mother ([place] Let her know that Dr. Anne HahnWillis is out of the office today and tomorrow. We did receive records from Dr. Thomasena Edisollins on Friday. Pt/family is requesting referral to Pain Management as well as another MRI per Dr. Thomasena Edisollins' recommendations. Informed her that refill request was sent to work-in doctor and would not be ready for pick-up until tomorrow. Verbalized understanding and appreciation for call.

## 2016-04-02 NOTE — Telephone Encounter (Signed)
Last saw Dr. Anne HahnWillis for Botox on 02/24/16 Has follow-up scheduled 05/29/16 Last refill 02/15/16

## 2016-04-03 ENCOUNTER — Telehealth: Payer: Self-pay | Admitting: Neurology

## 2016-04-03 ENCOUNTER — Other Ambulatory Visit: Payer: Self-pay | Admitting: Neurology

## 2016-04-03 MED ORDER — BUSPIRONE HCL 5 MG PO TABS: ORAL_TABLET | ORAL | 1 refills | Status: DC

## 2016-04-03 NOTE — Telephone Encounter (Signed)
Rx up front for pt pick-up. 

## 2016-04-03 NOTE — Telephone Encounter (Signed)
[facility] [place] [phone] called to advise, fax was received for HYDROmorphone (DILAUDID) 4 MG tablet, [facility] advises that this is a C2 medication, they can only receive electronic faxes for C2 medications. Please call.

## 2016-04-03 NOTE — Telephone Encounter (Signed)
I called the mother, it sounds like the purpose of the referral to [facility] is to consider nerve blocks. MRI requested appears to be of the cervical spine. I will review the medical referral note, and then place the orders.

## 2016-04-03 NOTE — Telephone Encounter (Signed)
Rx printed, signed, up front for pick-up. 

## 2016-04-03 NOTE — Telephone Encounter (Signed)
I called the mother, left a message, I will call back tomorrow morning.

## 2016-04-09 ENCOUNTER — Telehealth: Payer: Self-pay | Admitting: Neurology

## 2016-04-09 NOTE — Telephone Encounter (Signed)
Pt's mother called to schedule MRI appt. Mother said they are anxious to get it scheduled. [facility] phone # given to her also. She will call to schedule appt

## 2016-04-10 NOTE — Telephone Encounter (Signed)
Called patient and left a VM, then called patients mother and spoke with her. Schedule MRI apt for tomorrow.

## 2016-04-11 ENCOUNTER — Ambulatory Visit (INDEPENDENT_AMBULATORY_CARE_PROVIDER_SITE_OTHER): Payer: Managed Care, Other (non HMO)

## 2016-04-11 DIAGNOSIS — M542 Cervicalgia: Secondary | ICD-10-CM

## 2016-04-11 DIAGNOSIS — G43709 Chronic migraine without aura, not intractable, without status migrainosus: Secondary | ICD-10-CM

## 2016-04-11 DIAGNOSIS — IMO0002 Reserved for concepts with insufficient information to code with codable children: Secondary | ICD-10-CM

## 2016-04-13 ENCOUNTER — Telehealth: Payer: Self-pay | Admitting: Neurology

## 2016-04-13 NOTE — Telephone Encounter (Signed)
I called the patient. The MRI of the cervical spine is relatively normal.   MRI cervical 04/12/16:  IMPRESSION:  Unremarkable MRI cervical spine (without). Minimal disc bulging from C3-4 to C6-7. No spinal stenosis or foraminal narrowing.

## 2016-05-09 ENCOUNTER — Telehealth: Payer: Self-pay

## 2016-05-09 MED ORDER — LORAZEPAM 0.5 MG PO TABS: 0.5000 mg | ORAL_TABLET | Freq: Three times a day (TID) | ORAL | 1 refills | Status: DC | PRN

## 2016-05-09 NOTE — Addendum Note (Signed)
Addended by: Stephanie AcreWILLIS, CHARLES on: 05/09/2016 05:32 PM   Modules accepted: Orders

## 2016-05-09 NOTE — Telephone Encounter (Signed)
I have called in the prescription for the alprazolam.

## 2016-05-09 NOTE — Telephone Encounter (Signed)
Refill request for Lorazepam 0.5mg , [facility] [address], [place].

## 2016-05-10 ENCOUNTER — Other Ambulatory Visit: Payer: Self-pay | Admitting: Neurology

## 2016-05-10 ENCOUNTER — Other Ambulatory Visit: Payer: Self-pay | Admitting: Diagnostic Neuroimaging

## 2016-05-10 ENCOUNTER — Other Ambulatory Visit: Payer: Self-pay | Admitting: *Deleted

## 2016-05-10 ENCOUNTER — Encounter: Payer: Self-pay | Admitting: Neurology

## 2016-05-10 ENCOUNTER — Encounter: Payer: Self-pay | Admitting: *Deleted

## 2016-05-10 MED ORDER — HYDROMORPHONE HCL 4 MG PO TABS: 4.0000 mg | ORAL_TABLET | Freq: Four times a day (QID) | ORAL | 0 refills | Status: DC | PRN

## 2016-05-10 NOTE — Telephone Encounter (Signed)
Hydromorphone refill request 

## 2016-05-29 ENCOUNTER — Ambulatory Visit: Payer: Managed Care, Other (non HMO) | Admitting: Neurology

## 2016-06-11 ENCOUNTER — Other Ambulatory Visit: Payer: Self-pay | Admitting: Neurology

## 2016-06-12 ENCOUNTER — Other Ambulatory Visit: Payer: Self-pay | Admitting: Neurology

## 2016-06-13 MED ORDER — HYDROMORPHONE HCL 4 MG PO TABS
4.0000 mg | ORAL_TABLET | Freq: Four times a day (QID) | ORAL | 0 refills | Status: DC | PRN
Start: 2016-06-13 — End: 2016-08-24

## 2016-06-13 NOTE — Telephone Encounter (Signed)
Rx printed, signed, up front for pick-up. 

## 2016-06-14 ENCOUNTER — Telehealth: Payer: Self-pay

## 2016-06-14 NOTE — Telephone Encounter (Signed)
Appt scheduled w/ Dr. Anne HahnWillis per pt preference on 06/26/16 12:00 arrival time.

## 2016-06-14 NOTE — Telephone Encounter (Signed)
-----   Message from York Spanielharles K Willis, MD sent at 06/13/2016  8:18 AM EDT ----- This patient will need a revisit schedule, okay to see NP

## 2016-06-18 ENCOUNTER — Other Ambulatory Visit: Payer: Self-pay | Admitting: Neurology

## 2016-06-26 ENCOUNTER — Encounter: Payer: Self-pay | Admitting: Neurology

## 2016-06-26 ENCOUNTER — Ambulatory Visit (INDEPENDENT_AMBULATORY_CARE_PROVIDER_SITE_OTHER): Payer: Managed Care, Other (non HMO) | Admitting: Neurology

## 2016-06-26 VITALS — BP 118/86 | HR 60 | Ht 67.0 in | Wt 172.5 lb

## 2016-06-26 DIAGNOSIS — G43711 Chronic migraine without aura, intractable, with status migrainosus: Secondary | ICD-10-CM | POA: Diagnosis not present

## 2016-06-26 MED ORDER — KETOROLAC TROMETHAMINE 10 MG PO TABS: 10.0000 mg | ORAL_TABLET | Freq: Three times a day (TID) | ORAL | 3 refills | Status: DC | PRN

## 2016-06-26 NOTE — Progress Notes (Signed)
Reason for visit: Headache  Tracey Ellison is an 25 y.o. female  History of present illness:  Tracey Ellison is a 25 year old right-handed white female with a history of intractable migraine headache. The patient is having ongoing daily headaches. The headaches are less severe however, the patient is now being [facility], she is getting C2-3 nerve blocks, this seems to help the headache severity, not the frequency. The patient has not had any Botox since July 2017. The patient is on Dilaudid if needed, she is taking 1 tablet on average every other day, and she is starting to develop tolerance on this medication. The patient is no longer working currently, she is looking for a job. She is having some problems with hot flashes at times. She returns to the office today for an evaluation.  Past Medical History:  Diagnosis Date  . Chronic headaches   . Intractable chronic migraine without aura 10/26/2014  . Raynauds phenomenon     Past Surgical History:  Procedure Laterality Date  . WISDOM TOOTH EXTRACTION      Family History  Problem Relation Age of Onset  . Hypertension Father   . Leukemia Neg Hx     Hx. of leukemia on father's side of family  . Lung cancer Neg Hx     Hx. of lung cancer on father's side of family  . Breast cancer Neg Hx     Hx. of breast cancer on father's side of family  . Cancer Neg Hx     Hx. of intestinal cancer on father's side of family    Social history:  reports that she has never smoked. She has never used smokeless tobacco. She reports that she does not drink alcohol or use drugs.    Allergies  Allergen Reactions  . Metoclopramide Hcl Anaphylaxis    hives  . Amitriptyline Other (See Comments)    Dizzy, anxiety, night terrors  . Decadron [Dexamethasone]     anxiety    Medications:  Prior to Admission medications   Medication Sig Start Date End Date Taking? Authorizing Provider  gabapentin (NEURONTIN) 300 MG capsule  TAKE 1 CAPSULE (300 MG TOTAL) BY MOUTH 2 (TWO) TIMES DAILY. Patient taking differently: TAKE 3 CAPS AT BEDTIME 06/18/16  Yes York Spanielharles K Antonisha Waskey, MD  HYDROmorphone (DILAUDID) 4 MG tablet Take 1 tablet (4 mg total) by mouth every 6 (six) hours as needed for severe pain. Must last 28 days 06/13/16  Yes York Spanielharles K Breydon Senters, MD  LORazepam (ATIVAN) 0.5 MG tablet Take 1 tablet (0.5 mg total) by mouth every 8 (eight) hours as needed for anxiety. 05/09/16  Yes York Spanielharles K Argie Applegate, MD  norethindrone-ethinyl estradiol (JUNEL FE,GILDESS FE,LOESTRIN FE) 1-20 MG-MCG tablet Take by mouth.   Yes Historical Provider, MD  OnabotulinumtoxinA (BOTOX IJ) Inject as directed every 3 (three) months.   Yes Historical Provider, MD  promethazine (PHENERGAN) 25 MG tablet TAKE 1 TABLET BY MOUTH EVERY 6 HOURS AS NEEDED FOR NAUSEA 07/13/14  Yes York Spanielharles K Arisbel Maione, MD  SYNTHROID 50 MCG tablet Take 50 mcg by mouth daily. 10/01/13  Yes Historical Provider, MD  venlafaxine XR (EFFEXOR-XR) 75 MG 24 hr capsule 1 CAPSULE IN THE MORNING, 2 CAPSULES IN THE EVENING 06/11/16  Yes York Spanielharles K Francesa Eugenio, MD  ketorolac (TORADOL) 10 MG tablet Take 1 tablet (10 mg total) by mouth every 8 (eight) hours as needed. 06/26/16   York Spanielharles K Charnele Semple, MD    ROS:  Out of a complete  14 system review of symptoms, the patient complains only of the following symptoms, and all other reviewed systems are negative.  Chills, excessive sweating Insomnia Joint pain, neck pain Headache Depression, anxiety  Blood pressure 118/86, pulse 60, height 5\' 7"  (1.702 m), weight 172 lb 8 oz (78.2 kg).  Physical Exam  General: The patient is alert and cooperative at the time of the examination.  Neuromuscular: Range of movement the cervical spine is full.  Skin: No significant peripheral edema is noted.   Neurologic Exam  Mental status: The patient is alert and oriented x 3 at the time of the examination. The patient has apparent normal recent and remote memory, with an  apparently normal attention span and concentration ability.   Cranial nerves: Facial symmetry is present. Speech is normal, no aphasia or dysarthria is noted. Extraocular movements are full. Visual fields are full.  Motor: The patient has good strength in all 4 extremities.  Sensory examination: Soft touch sensation is symmetric on the face, arms, and legs.  Coordination: The patient has good finger-nose-finger and heel-to-shin bilaterally.  Gait and station: The patient has a normal gait. Tandem gait is normal. Romberg is negative. No drift is seen.  Reflexes: Deep tendon reflexes are symmetric.   MRI cervical 04/11/16:  IMPRESSION:  Unremarkable MRI cervical spine (without). Minimal disc bulging from C3-4 to C6-7. No spinal stenosis or foraminal narrowing.   * MRI scan images were reviewed online. I agree with the written report.    Assessment/Plan:  1. Intractable migraine headache  The patient is getting upper cervical spine injections which seemed to be of some benefit. The patient has postponed the Botox injections for now, this can be started anytime in the future. The patient was given Toradol for pain control, she was cautioned about frequent use of Dilaudid and the possibility of tolerance. The patient will follow-up in about 4 or 5 months.  Marlan Palau. Keith Lailany Enoch MD 06/26/2016 1:20 PM  [facility] [address] [place], [place] [address]  Phone [phone] Fax [phone]

## 2016-08-24 ENCOUNTER — Other Ambulatory Visit: Payer: Self-pay | Admitting: Neurology

## 2016-08-27 MED ORDER — HYDROMORPHONE HCL 4 MG PO TABS: 4.0000 mg | ORAL_TABLET | Freq: Four times a day (QID) | ORAL | 0 refills | Status: DC | PRN

## 2016-08-27 NOTE — Telephone Encounter (Signed)
Rx printed, signed, up front for pick-up. 

## 2016-08-27 NOTE — Telephone Encounter (Signed)
Pt had OV in Oct and follow-up is scheduled for April. Last rx written 06/13/16.

## 2016-10-08 ENCOUNTER — Other Ambulatory Visit: Payer: Self-pay | Admitting: Neurology

## 2016-10-09 MED ORDER — HYDROMORPHONE HCL 4 MG PO TABS: 4.0000 mg | ORAL_TABLET | Freq: Four times a day (QID) | ORAL | 0 refills | Status: DC | PRN

## 2016-11-27 ENCOUNTER — Encounter: Payer: Self-pay | Admitting: Neurology

## 2016-11-27 ENCOUNTER — Ambulatory Visit (INDEPENDENT_AMBULATORY_CARE_PROVIDER_SITE_OTHER): Payer: Self-pay | Admitting: Neurology

## 2016-11-27 VITALS — BP 130/87 | HR 89 | Ht 67.0 in | Wt 173.0 lb

## 2016-11-27 DIAGNOSIS — G43711 Chronic migraine without aura, intractable, with status migrainosus: Secondary | ICD-10-CM

## 2016-11-27 MED ORDER — PHENTERMINE HCL 37.5 MG PO CAPS: 37.5000 mg | ORAL_CAPSULE | ORAL | 4 refills | Status: DC

## 2016-11-27 MED ORDER — VENLAFAXINE HCL ER 75 MG PO CP24: 75.0000 mg | ORAL_CAPSULE | Freq: Every day | ORAL | Status: DC

## 2016-11-27 NOTE — Progress Notes (Signed)
Reason for visit: Migraine headache  Tracey Ellison is an 26 y.o. female  History of present illness:  Tracey Ellison is a 26 year old right-handed white female with a history of intractable migraine headaches. The patient has been seen through a pain center and she has gotten upper cervical nerve root ablation therapy which has helped her headaches immensely. She is no longer having daily headaches, she may have 2 or 3 headaches a week, she takes 75 mg of Effexor daily, she has come off of the gabapentin. She takes one half of a 4 mg Dilaudid tablet if needed for the headache. She is still concerned about her weight, she is exercising 3 or 4 times a week, she has been seen by a dietitian, but she still has not lost any weight. The patient is sleeping well at night. She returns to this office for an evaluation. She is currently working, she claims that she is not missing work because of the headache.  Past Medical History:  Diagnosis Date  . Chronic headaches   . Intractable chronic migraine without aura 10/26/2014  . Raynauds phenomenon     Past Surgical History:  Procedure Laterality Date  . WISDOM TOOTH EXTRACTION      Family History  Problem Relation Age of Onset  . Hypertension Father   . Leukemia Neg Hx     Hx. of leukemia on father's side of family  . Lung cancer Neg Hx     Hx. of lung cancer on father's side of family  . Breast cancer Neg Hx     Hx. of breast cancer on father's side of family  . Cancer Neg Hx     Hx. of intestinal cancer on father's side of family    Social history:  reports that she has never smoked. She has never used smokeless tobacco. She reports that she does not drink alcohol or use drugs.    Allergies  Allergen Reactions  . Metoclopramide Hcl Anaphylaxis    hives  . Amitriptyline Other (See Comments)    Dizzy, anxiety, night terrors  . Decadron [Dexamethasone]     anxiety    Medications:  Prior to Admission medications   Medication Sig  Start Date End Date Taking? Authorizing Provider  HYDROmorphone (DILAUDID) 4 MG tablet Take 1 tablet (4 mg total) by mouth every 6 (six) hours as needed for severe pain. Must last 28 days 10/09/16  Yes York Spaniel, MD  norethindrone-ethinyl estradiol (JUNEL FE,GILDESS FE,LOESTRIN FE) 1-20 MG-MCG tablet Take by mouth.   Yes Historical Provider, MD  SYNTHROID 50 MCG tablet Take 50 mcg by mouth daily. 10/01/13  Yes Historical Provider, MD  venlafaxine XR (EFFEXOR-XR) 75 MG 24 hr capsule 1 CAPSULE IN THE MORNING, 2 CAPSULES IN THE EVENING 06/11/16  Yes York Spaniel, MD    ROS:  Out of a complete 14 system review of symptoms, the patient complains only of the following symptoms, and all other reviewed systems are negative.  Headache Depression Neck pain  Blood pressure 130/87, pulse 89, height  (1.702 m), weight 173 lb (78.5 kg).  Physical Exam  General: The patient is alert and cooperative at the time of the examination.  Neuromuscular: Range of movement of the cervical spine is full.  Skin: No significant peripheral edema is noted.   Neurologic Exam  Mental status: The patient is alert and oriented x 3 at the time of the examination. The patient has apparent normal recent and remote memory,  with an apparently normal attention span and concentration ability.   Cranial nerves: Facial symmetry is present. Speech is normal, no aphasia or dysarthria is noted. Extraocular movements are full. Visual fields are full.  Motor: The patient has good strength in all 4 extremities.  Sensory examination: Soft touch sensation is symmetric on the face, arms, and legs.  Coordination: The patient has good finger-nose-finger and heel-to-shin bilaterally.  Gait and station: The patient has a normal gait. Tandem gait is normal. Romberg is negative. No drift is seen.  Reflexes: Deep tendon reflexes are symmetric.   Assessment/Plan:  1. Intractable migraine headache  The patient will  be given phentermine once again for weight loss. The patient will continue the Effexor, she will follow-up in 6 months, she has done well with the cervical ablation therapy. She has not gotten any recent Botox injections.  Marlan Palau MD 11/27/2016 8:05 AM  [facility] [address] [place], [place] [address]  Phone [phone] Fax [phone]

## 2016-12-14 ENCOUNTER — Encounter: Payer: Self-pay | Admitting: Neurology

## 2016-12-31 ENCOUNTER — Other Ambulatory Visit: Payer: Self-pay | Admitting: Neurology

## 2016-12-31 MED ORDER — HYDROMORPHONE HCL 4 MG PO TABS: 4.0000 mg | ORAL_TABLET | Freq: Four times a day (QID) | ORAL | 0 refills | Status: DC | PRN

## 2017-01-01 NOTE — Telephone Encounter (Signed)
RX for dilaudid placed for pick up at the front desk.

## 2017-01-10 ENCOUNTER — Encounter: Payer: Self-pay | Admitting: Neurology

## 2017-01-11 ENCOUNTER — Encounter: Payer: Self-pay | Admitting: Neurology

## 2017-01-31 ENCOUNTER — Other Ambulatory Visit: Payer: Self-pay | Admitting: Neurology

## 2017-02-01 MED ORDER — HYDROMORPHONE HCL 4 MG PO TABS
4.0000 mg | ORAL_TABLET | Freq: Four times a day (QID) | ORAL | 0 refills | Status: DC | PRN
Start: 2017-02-01 — End: 2017-03-13

## 2017-02-01 NOTE — Telephone Encounter (Signed)
Placed printed/signed rx hydromorphone up front for patient pick up.  

## 2017-02-01 NOTE — Telephone Encounter (Signed)
Last filled 12/31/16

## 2017-02-06 ENCOUNTER — Encounter: Payer: Self-pay | Admitting: Neurology

## 2017-02-07 ENCOUNTER — Other Ambulatory Visit: Payer: Self-pay | Admitting: Neurology

## 2017-02-07 MED ORDER — DESVENLAFAXINE SUCCINATE ER 25 MG PO TB24: 25.0000 mg | ORAL_TABLET | Freq: Every day | ORAL | 1 refills | Status: DC

## 2017-03-12 ENCOUNTER — Other Ambulatory Visit: Payer: Self-pay | Admitting: Neurology

## 2017-03-13 ENCOUNTER — Other Ambulatory Visit: Payer: Self-pay | Admitting: Neurology

## 2017-03-13 MED ORDER — HYDROMORPHONE HCL 4 MG PO TABS: 4.0000 mg | ORAL_TABLET | Freq: Four times a day (QID) | ORAL | 0 refills | Status: DC | PRN

## 2017-03-14 NOTE — Telephone Encounter (Signed)
Placed printed/signed rx dilaudid up front for patient pick up.

## 2017-04-09 ENCOUNTER — Encounter: Payer: Self-pay | Admitting: Neurology

## 2017-04-10 ENCOUNTER — Other Ambulatory Visit: Payer: Self-pay | Admitting: Neurology

## 2017-04-10 MED ORDER — ONDANSETRON HCL 4 MG PO TABS: 4.0000 mg | ORAL_TABLET | Freq: Three times a day (TID) | ORAL | 2 refills | Status: DC | PRN

## 2017-04-14 ENCOUNTER — Other Ambulatory Visit: Payer: Self-pay | Admitting: Neurology

## 2017-04-15 ENCOUNTER — Other Ambulatory Visit: Payer: Self-pay | Admitting: Neurology

## 2017-04-16 ENCOUNTER — Other Ambulatory Visit: Payer: Self-pay | Admitting: *Deleted

## 2017-04-16 MED ORDER — HYDROMORPHONE HCL 4 MG PO TABS: 4.0000 mg | ORAL_TABLET | Freq: Four times a day (QID) | ORAL | 0 refills | Status: DC | PRN

## 2017-05-12 ENCOUNTER — Other Ambulatory Visit: Payer: Self-pay | Admitting: Neurology

## 2017-06-05 ENCOUNTER — Ambulatory Visit: Payer: Self-pay | Admitting: Neurology

## 2017-06-10 ENCOUNTER — Other Ambulatory Visit: Payer: Self-pay | Admitting: Neurology

## 2017-06-11 MED ORDER — HYDROMORPHONE HCL 4 MG PO TABS: 4.0000 mg | ORAL_TABLET | Freq: Four times a day (QID) | ORAL | 0 refills | Status: DC | PRN

## 2017-06-11 NOTE — Telephone Encounter (Signed)
Dr. Anne HahnWillis, ok to refill?

## 2017-06-12 NOTE — Telephone Encounter (Signed)
Placed printed/signed rx up front for patient pick up.  

## 2017-07-18 ENCOUNTER — Other Ambulatory Visit: Payer: Self-pay

## 2017-07-18 ENCOUNTER — Ambulatory Visit: Payer: BLUE CROSS/BLUE SHIELD | Admitting: Neurology

## 2017-07-18 ENCOUNTER — Encounter: Payer: Self-pay | Admitting: Neurology

## 2017-07-18 VITALS — BP 121/85 | HR 96 | Ht 67.0 in | Wt 166.0 lb

## 2017-07-18 DIAGNOSIS — G43711 Chronic migraine without aura, intractable, with status migrainosus: Secondary | ICD-10-CM

## 2017-07-18 MED ORDER — HYDROMORPHONE HCL 4 MG PO TABS: 4.0000 mg | ORAL_TABLET | Freq: Four times a day (QID) | ORAL | 0 refills | Status: DC | PRN

## 2017-07-18 NOTE — Progress Notes (Signed)
Reason for visit: Migraine headache  Tracey BreslowLindsey M Ellison is an 26 y.o. female  History of present illness:  Tracey Ellison is a 26 year old right-handed white female with a history of intractable headaches.  The patient has not responded to multiple medications, she is now followed through a pain center and is getting radiofrequency ablation procedures at the C2-3 level which have been extremely effective.  She just underwent a procedure on 06 June 2017.  The patient has had a reduction in her headache frequency and severity, she is averaging about 2 headaches a week, the headaches are less severe.  Occasionally she may have to go home early from work but she is not completely missing work at this time.  She takes Dilaudid if needed for the headache pain.  The patient is quite pleased with her progress.  She returns to the office today for an evaluation.  Past Medical History:  Diagnosis Date  . Chronic headaches   . Intractable chronic migraine without aura 10/26/2014  . Raynauds phenomenon     Past Surgical History:  Procedure Laterality Date  . WISDOM TOOTH EXTRACTION      Family History  Problem Relation Age of Onset  . Hypertension Father   . Leukemia Neg Hx        Hx. of leukemia on father's side of family  . Lung cancer Neg Hx        Hx. of lung cancer on father's side of family  . Breast cancer Neg Hx        Hx. of breast cancer on father's side of family  . Cancer Neg Hx        Hx. of intestinal cancer on father's side of family    Social history:  reports that  has never smoked. she has never used smokeless tobacco. She reports that she does not drink alcohol or use drugs.    Allergies  Allergen Reactions  . Metoclopramide Hcl Anaphylaxis    hives  . Amitriptyline Other (See Comments)    Dizzy, anxiety, night terrors  . Decadron [Dexamethasone]     anxiety    Medications:  Prior to Admission medications   Medication Sig Start Date End Date Taking? Authorizing  Provider  Desvenlafaxine Succinate ER 25 MG TB24 TAKE ONE TABLET BY MOUTH DAILY 05/13/17  Yes York SpanielWillis, Gerrod Maule K, MD  HYDROmorphone (DILAUDID) 4 MG tablet Take 1 tablet (4 mg total) by mouth every 6 (six) hours as needed for severe pain. Must last 28 days 06/11/17  Yes York SpanielWillis, Kasin Tonkinson K, MD  norethindrone-ethinyl estradiol (JUNEL FE,GILDESS FE,LOESTRIN FE) 1-20 MG-MCG tablet Take by mouth.   Yes [provider]  ondansetron (ZOFRAN) 4 MG tablet Take 1 tablet (4 mg total) by mouth every 8 (eight) hours as needed for nausea or vomiting. 04/10/17  Yes York SpanielWillis, Kairav Russomanno K, MD  SYNTHROID 50 MCG tablet Take 50 mcg by mouth daily. 10/01/13  Yes [provider]    ROS:  Out of a complete 14 system review of symptoms, the patient complains only of the following symptoms, and all other reviewed systems are negative.  Headache Neck pain Depression  Blood pressure 121/85, pulse 96, height 5\' 7"  (1.702 m), weight 166 lb (75.3 kg), SpO2 98 %.  Physical Exam  General: The patient is alert and cooperative at the time of the examination.  Skin: No significant peripheral edema is noted.   Neurologic Exam  Mental status: The patient is alert and oriented x 3 at  the time of the examination. The patient has apparent normal recent and remote memory, with an apparently normal attention span and concentration ability.   Cranial nerves: Facial symmetry is present. Speech is normal, no aphasia or dysarthria is noted. Extraocular movements are full. Visual fields are full.  Motor: The patient has good strength in all 4 extremities.  Sensory examination: Soft touch sensation is symmetric on the face, arms, and legs.  Coordination: The patient has good finger-nose-finger and heel-to-shin bilaterally.  Gait and station: The patient has a normal gait. Tandem gait is normal. Romberg is negative. No drift is seen.  Reflexes: Deep tendon reflexes are symmetric.   Assessment/Plan:  1.   Intractable migraine headache  The patient is doing well currently, the radiofrequency ablation procedures have done well for her.  The patient will be given a prescription for her Dilaudid, she will return to this office in about 6 months for an evaluation.  Marlan Palau. Keith Kaelene Elliston MD 07/18/2017 9:09 AM  [facility] [address] [place], [place] [address]  Phone [phone] Fax [phone]

## 2017-09-03 ENCOUNTER — Other Ambulatory Visit: Payer: Self-pay | Admitting: Neurology

## 2017-09-03 MED ORDER — HYDROMORPHONE HCL 4 MG PO TABS: 4.0000 mg | ORAL_TABLET | Freq: Four times a day (QID) | ORAL | 0 refills | Status: DC | PRN

## 2017-09-04 ENCOUNTER — Telehealth: Payer: Self-pay | Admitting: *Deleted

## 2017-09-04 NOTE — Telephone Encounter (Signed)
Placed printed/signed rx Dilaudid up front for patient pick up.

## 2017-09-15 ENCOUNTER — Other Ambulatory Visit: Payer: Self-pay | Admitting: Neurology

## 2017-10-16 ENCOUNTER — Other Ambulatory Visit: Payer: Self-pay | Admitting: Neurology

## 2017-10-16 MED ORDER — HYDROMORPHONE HCL 4 MG PO TABS: 4.0000 mg | ORAL_TABLET | Freq: Four times a day (QID) | ORAL | 0 refills | Status: DC | PRN

## 2017-10-16 NOTE — Telephone Encounter (Signed)
Called and spoke with pt. Advised Dr. Anne HahnWillis can now send prescription to her pharmacy. No need to pick up rx. He refilled for her. She verbalized understanding and appreciation for call.

## 2017-11-18 ENCOUNTER — Other Ambulatory Visit: Payer: Self-pay | Admitting: Neurology

## 2017-11-18 MED ORDER — HYDROMORPHONE HCL 4 MG PO TABS: 4.0000 mg | ORAL_TABLET | Freq: Four times a day (QID) | ORAL | 0 refills | Status: DC | PRN

## 2017-12-17 ENCOUNTER — Other Ambulatory Visit: Payer: Self-pay | Admitting: Neurology

## 2017-12-18 MED ORDER — HYDROMORPHONE HCL 4 MG PO TABS: 4.0000 mg | ORAL_TABLET | Freq: Four times a day (QID) | ORAL | 0 refills | Status: DC | PRN

## 2018-01-16 ENCOUNTER — Encounter: Payer: Self-pay | Admitting: Neurology

## 2018-01-16 ENCOUNTER — Ambulatory Visit: Payer: BLUE CROSS/BLUE SHIELD | Admitting: Neurology

## 2018-01-16 VITALS — BP 125/86 | HR 92 | Ht 67.0 in | Wt 172.0 lb

## 2018-01-16 DIAGNOSIS — G43711 Chronic migraine without aura, intractable, with status migrainosus: Secondary | ICD-10-CM

## 2018-01-16 DIAGNOSIS — Z79891 Long term (current) use of opiate analgesic: Secondary | ICD-10-CM | POA: Diagnosis not present

## 2018-01-16 MED ORDER — HYDROMORPHONE HCL 4 MG PO TABS: 4.0000 mg | ORAL_TABLET | Freq: Four times a day (QID) | ORAL | 0 refills | Status: DC | PRN

## 2018-01-16 MED ORDER — DICLOFENAC POTASSIUM 50 MG PO TABS: 50.0000 mg | ORAL_TABLET | Freq: Three times a day (TID) | ORAL | 3 refills | Status: DC | PRN

## 2018-01-16 MED ORDER — ERENUMAB-AOOE 140 MG/ML ~~LOC~~ SOAJ: 140.0000 mg | SUBCUTANEOUS | 4 refills | Status: DC

## 2018-01-16 MED ORDER — TRAZODONE HCL 50 MG PO TABS: 50.0000 mg | ORAL_TABLET | Freq: Every day | ORAL | 3 refills | Status: DC

## 2018-01-16 NOTE — Patient Instructions (Signed)
We will start trazodone for sleep and use Aimovig for the headache.

## 2018-01-16 NOTE — Progress Notes (Signed)
Reason for visit: Migraine headache  Tracey Ellison is an 27 y.o. female  History of present illness:  Tracey Ellison is a 27 year old right-handed white female with a history of intractable migraine headache.  The patient has been getting C2-3 ablation procedures in the past, they seem to have worked well but the last procedure was done in October 2018.  The patient has not gotten good long-lasting benefit from this, by the end of December 2018 she began having increased headaches again, and now the headaches are daily in nature.  The patient is missing work because of the headache and she is having increased problems with depression and anxiety as she sees her ability to function decline, and she feels bad all the time.  The patient is using CBD oil with minimal benefit.  She uses Dilaudid if needed for the headache.  In the past she has been on Effexor, Lyrica, gabapentin, Topamax, Amrix, nortriptyline, imipramine, and Botox therapy without benefit.  She has also been on propanolol.  The patient is not sleeping well because of the headache.  She returns to the office today for an evaluation.  The patient is desperate to find a cure for her migraine.  Past Medical History:  Diagnosis Date  . Chronic headaches   . Intractable chronic migraine without aura 10/26/2014  . Raynauds phenomenon     Past Surgical History:  Procedure Laterality Date  . WISDOM TOOTH EXTRACTION      Family History  Problem Relation Age of Onset  . Hypertension Father   . Leukemia Neg Hx        Hx. of leukemia on father's side of family  . Lung cancer Neg Hx        Hx. of lung cancer on father's side of family  . Breast cancer Neg Hx        Hx. of breast cancer on father's side of family  . Cancer Neg Hx        Hx. of intestinal cancer on father's side of family    Social history:  reports that she has never smoked. She has never used smokeless tobacco. She reports that she does not drink alcohol or use  drugs.    Allergies  Allergen Reactions  . Metoclopramide Hcl Anaphylaxis    hives  . Amitriptyline Other (See Comments)    Dizzy, anxiety, night terrors  . Decadron [Dexamethasone]     anxiety    Medications:  Prior to Admission medications   Medication Sig Start Date End Date Taking? Authorizing Provider  Desvenlafaxine Succinate ER 25 MG TB24 TAKE ONE TABLET BY MOUTH DAILY 09/16/17  Yes York Spaniel, MD  HYDROmorphone (DILAUDID) 4 MG tablet Take 1 tablet (4 mg total) by mouth every 6 (six) hours as needed for severe pain. Must last 28 days 01/16/18  Yes York Spaniel, MD  norethindrone-ethinyl estradiol (JUNEL FE,GILDESS FE,LOESTRIN FE) 1-20 MG-MCG tablet Take by mouth.   Yes [provider]  ondansetron (ZOFRAN) 4 MG tablet Take 1 tablet (4 mg total) by mouth every 8 (eight) hours as needed for nausea or vomiting. 04/10/17  Yes York Spaniel, MD  SYNTHROID 50 MCG tablet Take 50 mcg by mouth daily. 10/01/13  Yes [provider]  diclofenac (CATAFLAM) 50 MG tablet Take 1 tablet (50 mg total) by mouth 3 (three) times daily as needed. 01/16/18   York Spaniel, MD  Erenumab-aooe (AIMOVIG) 140 MG/ML SOAJ Inject 140 mg into the skin  every 30 (thirty) days. 01/16/18   York Spaniel, MD  traZODone (DESYREL) 50 MG tablet Take 1 tablet (50 mg total) by mouth at bedtime. 01/16/18   York Spaniel, MD    ROS:  Out of a complete 14 system review of symptoms, the patient complains only of the following symptoms, and all other reviewed systems are negative.  Headache Depression, anxiety Insomnia, daytime sleepiness Joint pain, back pain, neck pain  Blood pressure 125/86, pulse 92, height  (1.702 m), weight 172 lb (78 kg).  Physical Exam  General: The patient is alert and cooperative at the time of the examination.  Skin: No significant peripheral edema is noted.   Neurologic Exam  Mental status: The patient is alert and oriented x 3 at the  time of the examination. The patient has apparent normal recent and remote memory, with an apparently normal attention span and concentration ability.   Cranial nerves: Facial symmetry is present. Speech is normal, no aphasia or dysarthria is noted. Extraocular movements are full. Visual fields are full.  Motor: The patient has good strength in all 4 extremities.  Sensory examination: Soft touch sensation is symmetric on the face, arms, and legs.  Coordination: The patient has good finger-nose-finger and heel-to-shin bilaterally.  Gait and station: The patient has a normal gait. Tandem gait is normal. Romberg is negative. No drift is seen.  Reflexes: Deep tendon reflexes are symmetric.   Assessment/Plan:  1.  Intractable migraine headache  The patient is doing poorly with her headaches currently.  She will be given a trial on Aimovig, her headaches currently are daily in nature.  The patient will be given trazodone 50 mg at night for sleep, she will call for any dose adjustments.  The patient will be given another prescription for her Dilaudid.  She will have a urine drug screen today.  She will follow-up in 4 months.  Marlan Palau MD 01/16/2018 8:32 AM  [facility] [address] [place], [place] [address]  Phone [phone] Fax [phone]

## 2018-01-22 LAB — COMPLIANCE DRUG ANALYSIS, UR

## 2018-02-03 ENCOUNTER — Encounter: Payer: Self-pay | Admitting: Neurology

## 2018-02-03 ENCOUNTER — Telehealth: Payer: Self-pay | Admitting: *Deleted

## 2018-02-03 NOTE — Telephone Encounter (Signed)
Initiated PA Aimovig 140mg  dose (1pen/month) on covermymeds. Key: [id number]: [id number]. In process of completing.

## 2018-02-03 NOTE — Telephone Encounter (Signed)
PA completed and APPROVED, Effective from 02/03/2018 through 05/03/2018  Pharmacy made aware.

## 2018-02-18 ENCOUNTER — Other Ambulatory Visit: Payer: Self-pay | Admitting: Neurology

## 2018-02-19 MED ORDER — HYDROMORPHONE HCL 4 MG PO TABS: 4.0000 mg | ORAL_TABLET | Freq: Four times a day (QID) | ORAL | 0 refills | Status: DC | PRN

## 2018-03-14 ENCOUNTER — Other Ambulatory Visit: Payer: Self-pay | Admitting: Neurology

## 2018-03-16 ENCOUNTER — Other Ambulatory Visit: Payer: Self-pay | Admitting: Neurology

## 2018-03-18 ENCOUNTER — Encounter: Payer: Self-pay | Admitting: Neurology

## 2018-03-18 ENCOUNTER — Other Ambulatory Visit: Payer: Self-pay | Admitting: Neurology

## 2018-03-19 ENCOUNTER — Encounter: Payer: Self-pay | Admitting: Neurology

## 2018-03-19 ENCOUNTER — Other Ambulatory Visit: Payer: Self-pay | Admitting: Neurology

## 2018-03-19 ENCOUNTER — Telehealth: Payer: Self-pay | Admitting: *Deleted

## 2018-03-19 MED ORDER — HYDROMORPHONE HCL 4 MG PO TABS: 4.0000 mg | ORAL_TABLET | Freq: Four times a day (QID) | ORAL | 0 refills | Status: DC | PRN

## 2018-03-19 MED ORDER — SUVOREXANT 15 MG PO TABS: 15.0000 mg | ORAL_TABLET | Freq: Every evening | ORAL | 3 refills | Status: DC | PRN

## 2018-03-19 NOTE — Telephone Encounter (Signed)
Submitted PA [place] on coverymeds. KeyWilhemina Bonito: [id number] - Rx #: [id number]. Waiting on determination.

## 2018-03-19 NOTE — Telephone Encounter (Signed)
PA approved effective from 03/19/2018 through 08/19/2038.

## 2018-03-31 ENCOUNTER — Telehealth: Payer: Self-pay | Admitting: Neurology

## 2018-03-31 ENCOUNTER — Other Ambulatory Visit: Payer: Self-pay | Admitting: Neurology

## 2018-03-31 MED ORDER — KETOROLAC TROMETHAMINE 10 MG PO TABS: 10.0000 mg | ORAL_TABLET | Freq: Three times a day (TID) | ORAL | 1 refills | Status: DC | PRN

## 2018-03-31 MED ORDER — ESZOPICLONE 3 MG PO TABS: 3.0000 mg | ORAL_TABLET | Freq: Every day | ORAL | 0 refills | Status: DC

## 2018-03-31 NOTE — Telephone Encounter (Signed)
Ok for pharmacy to substitute?

## 2018-03-31 NOTE — Telephone Encounter (Signed)
Diclofenac sodium is more of a long-acting preparation, does not have the desired benefit with acute migraine, I will call in a small prescription for Toradol tablets.

## 2018-03-31 NOTE — Addendum Note (Signed)
Addended by: York SpanielWILLIS, CHARLES K on: 03/31/2018 03:17 PM   Modules accepted: Orders

## 2018-03-31 NOTE — Telephone Encounter (Signed)
[facility] diclofenac (CATAFLAM) 50 MG tablet is on back order. Is it ok to switch to diclofenac (voltaren). Please call to advise at [phone]

## 2018-04-13 ENCOUNTER — Other Ambulatory Visit: Payer: Self-pay | Admitting: Neurology

## 2018-04-23 ENCOUNTER — Telehealth: Payer: Self-pay | Admitting: *Deleted

## 2018-04-23 NOTE — Telephone Encounter (Addendum)
Called, LVM for pt to call back. Want to see how she is doing on Aimovig for migraines. Her current PA on file for rx will expire 05/03/18 and we will have to complete another to get re-authorized.   PA initiated on covermymeds. Key: [id number]

## 2018-04-24 ENCOUNTER — Other Ambulatory Visit: Payer: Self-pay

## 2018-04-24 ENCOUNTER — Other Ambulatory Visit: Payer: Self-pay | Admitting: Neurology

## 2018-04-24 MED ORDER — DESVENLAFAXINE SUCCINATE ER 50 MG PO TB24: 50.0000 mg | ORAL_TABLET | Freq: Every day | ORAL | 3 refills | Status: DC

## 2018-04-24 NOTE — Telephone Encounter (Signed)
Pt replied in mychart message 04/24/18 re: Tracey Ellison.

## 2018-04-25 MED ORDER — HYDROMORPHONE HCL 4 MG PO TABS: 4.0000 mg | ORAL_TABLET | Freq: Four times a day (QID) | ORAL | 0 refills | Status: DC | PRN

## 2018-04-25 NOTE — Telephone Encounter (Signed)
The [place] registry was checked, the prescription will be refilled. 

## 2018-04-25 NOTE — Telephone Encounter (Signed)
Submitted PA on covermymeds. Waiting on determination.   "Your information has been submitted to [employer] [place]. [employer] [place] will review the request and fax you a determination directly, typically within 3 business days of your submission once all necessary information is received. If [employer] [place] has not responded in 3 business days or if you have any questions about your submission, contact [employer] [place] at [phone]." 

## 2018-04-29 NOTE — Telephone Encounter (Signed)
Received fax notification from [facility] that PA re-approved effective 04/25/18-04/23/18. Ref#: [id number].

## 2018-05-20 ENCOUNTER — Ambulatory Visit: Payer: BLUE CROSS/BLUE SHIELD | Admitting: Neurology

## 2018-05-20 ENCOUNTER — Encounter: Payer: Self-pay | Admitting: Neurology

## 2018-05-20 VITALS — BP 130/78 | HR 93 | Ht 67.0 in | Wt 170.5 lb

## 2018-05-20 DIAGNOSIS — F5104 Psychophysiologic insomnia: Secondary | ICD-10-CM | POA: Diagnosis not present

## 2018-05-20 DIAGNOSIS — G43711 Chronic migraine without aura, intractable, with status migrainosus: Secondary | ICD-10-CM

## 2018-05-20 DIAGNOSIS — F410 Panic disorder [episodic paroxysmal anxiety] without agoraphobia: Secondary | ICD-10-CM | POA: Diagnosis not present

## 2018-05-20 HISTORY — DX: Psychophysiologic insomnia: F51.04

## 2018-05-20 MED ORDER — CLONAZEPAM 0.5 MG PO TABS
0.5000 mg | ORAL_TABLET | Freq: Two times a day (BID) | ORAL | 3 refills | Status: DC | PRN
Start: 2018-05-20 — End: 2018-06-13

## 2018-05-20 MED ORDER — RAMELTEON 8 MG PO TABS: 8.0000 mg | ORAL_TABLET | Freq: Every day | ORAL | 3 refills | Status: DC

## 2018-05-20 NOTE — Patient Instructions (Signed)
We will start clonazepam 0.5 mg at night for sleep and anxiety, Take Rozerem 8 mg as a sleep onset regulator.

## 2018-05-20 NOTE — Progress Notes (Signed)
Reason for visit: Headache, insomnia  Tracey Ellison is an 27 y.o. female  History of present illness:  Tracey Ellison is a 27 year old right-handed white female with a history of intractable migraine headache.  The patient is averaging 2 or 3 headaches a week, she will take half of a Dilaudid tablet which seems to help.  She is no longer getting the C2-3 injections.  The patient is mainly concerned about chronic insomnia at this point.  She has been taking Lunesta but this stopped working for her.  Ambien in the past has resulted in sleepwalking and strange dreams.  The patient could not tolerate trazodone as this increased her anxiety.  She has not been on mirtazapine as she is concerned about weight gain.  The patient is working, she is finding her job stressful.  On weekdays, she cannot get to sleep until 4 or 5 in the morning, on weekend nights, she sleeps well, she is able to fall asleep as soon as she goes to bed.  The patient is missing occasional days from work, she has missed 3 days in the last several months.  She returns for an evaluation.  Past Medical History:  Diagnosis Date  . Chronic headaches   . Intractable chronic migraine without aura 10/26/2014  . Raynauds phenomenon     Past Surgical History:  Procedure Laterality Date  . WISDOM TOOTH EXTRACTION      Family History  Problem Relation Age of Onset  . Hypertension Father   . Leukemia Neg Hx        Hx. of leukemia on father's side of family  . Lung cancer Neg Hx        Hx. of lung cancer on father's side of family  . Breast cancer Neg Hx        Hx. of breast cancer on father's side of family  . Cancer Neg Hx        Hx. of intestinal cancer on father's side of family    Social history:  reports that she has never smoked. She has never used smokeless tobacco. She reports that she does not drink alcohol or use drugs.    Allergies  Allergen Reactions  . Metoclopramide Hcl Anaphylaxis    hives  . Amitriptyline  Other (See Comments)    Dizzy, anxiety, night terrors  . Decadron [Dexamethasone]     anxiety    Medications:  Prior to Admission medications   Medication Sig Start Date End Date Taking? Authorizing Provider  desvenlafaxine (PRISTIQ) 50 MG 24 hr tablet Take 1 tablet (50 mg total) by mouth daily. 04/24/18  Yes York Spaniel, MD  diclofenac (CATAFLAM) 50 MG tablet Take 1 tablet (50 mg total) by mouth 3 (three) times daily as needed. 01/16/18  Yes York Spaniel, MD  Erenumab-aooe (AIMOVIG) 140 MG/ML SOAJ Inject 140 mg into the skin every 30 (thirty) days. 01/16/18  Yes York Spaniel, MD  HYDROmorphone (DILAUDID) 4 MG tablet Take 1 tablet (4 mg total) by mouth every 6 (six) hours as needed for severe pain. Must last 28 days 04/25/18  Yes York Spaniel, MD  ketorolac (TORADOL) 10 MG tablet Take 1 tablet (10 mg total) by mouth every 8 (eight) hours as needed. 03/31/18  Yes York Spaniel, MD  norethindrone-ethinyl estradiol (JUNEL FE,GILDESS FE,LOESTRIN FE) 1-20 MG-MCG tablet Take by mouth.   Yes [provider]  ondansetron (ZOFRAN) 4 MG tablet Take 1 tablet (4 mg total) by mouth  every 8 (eight) hours as needed for nausea or vomiting. 04/10/17  Yes York Spaniel, MD  SYNTHROID 50 MCG tablet Take 50 mcg by mouth daily. 10/01/13  Yes [provider]  clonazePAM (KLONOPIN) 0.5 MG tablet Take 1 tablet (0.5 mg total) by mouth 2 (two) times daily as needed for anxiety. 05/20/18   York Spaniel, MD  ramelteon (ROZEREM) 8 MG tablet Take 1 tablet (8 mg total) by mouth at bedtime. 05/20/18   York Spaniel, MD    ROS:  Out of a complete 14 system review of symptoms, the patient complains only of the following symptoms, and all other reviewed systems are negative.  Headache Insomnia, frequent waking, daytime sleepiness Back pain, neck pain Depression, anxiety  Blood pressure 130/78, pulse 93, height 5\' 7"  (1.702 m), weight 170 lb 8 oz (77.3 kg).  Physical  Exam  General: The patient is alert and cooperative at the time of the examination.  Skin: No significant peripheral edema is noted.   Neurologic Exam  Mental status: The patient is alert and oriented x 3 at the time of the examination. The patient has apparent normal recent and remote memory, with an apparently normal attention span and concentration ability.   Cranial nerves: Facial symmetry is present. Speech is normal, no aphasia or dysarthria is noted. Extraocular movements are full. Visual fields are full.  Motor: The patient has good strength in all 4 extremities.  Sensory examination: Soft touch sensation is symmetric on the face, arms, and legs.  Coordination: The patient has good finger-nose-finger and heel-to-shin bilaterally.  Gait and station: The patient has a normal gait. Tandem gait is normal. Romberg is negative. No drift is seen.  Reflexes: Deep tendon reflexes are symmetric.   Assessment/Plan:  1.  Chronic insomnia  2.  Anxiety disorder  3.  Intractable migraine headache  The patient is on Aimovig currently, she believes that she may be getting some benefit from it, she will continue the medication for now.  She will be placed on clonazepam for the anxiety and for sleep at night, taking 0.5 mg in the evening.  She will be placed on Rozerem as a sleep onset regulator in the evening hours.  She will follow-up in 6 months.  She will call for any dose adjustments.  She is on Pristiq 50 mg which has helped her underlying anxiety some.  Marlan Palau MD 05/20/2018 9:10 AM  [facility] [address] [place], [place] [address]  Phone [phone] Fax [phone]

## 2018-05-26 ENCOUNTER — Other Ambulatory Visit: Payer: Self-pay

## 2018-05-26 MED ORDER — HYDROMORPHONE HCL 4 MG PO TABS: 4.0000 mg | ORAL_TABLET | Freq: Four times a day (QID) | ORAL | 0 refills | Status: DC | PRN

## 2018-05-26 NOTE — Telephone Encounter (Signed)
Rx registry checked. Last fill date is 04/25/18 for #30. Next OV is 11/26/18.

## 2018-06-13 ENCOUNTER — Other Ambulatory Visit: Payer: Self-pay | Admitting: Neurology

## 2018-06-13 MED ORDER — BACLOFEN 10 MG PO TABS: 5.0000 mg | ORAL_TABLET | Freq: Every day | ORAL | 1 refills | Status: DC

## 2018-06-26 ENCOUNTER — Other Ambulatory Visit: Payer: Self-pay

## 2018-06-26 MED ORDER — HYDROMORPHONE HCL 4 MG PO TABS: 4.0000 mg | ORAL_TABLET | Freq: Four times a day (QID) | ORAL | 0 refills | Status: DC | PRN

## 2018-07-07 ENCOUNTER — Other Ambulatory Visit: Payer: Self-pay | Admitting: Neurology

## 2018-07-09 ENCOUNTER — Other Ambulatory Visit: Payer: Self-pay | Admitting: Neurology

## 2018-07-09 MED ORDER — QUETIAPINE FUMARATE 50 MG PO TABS: 100.0000 mg | ORAL_TABLET | Freq: Every day | ORAL | 3 refills | Status: DC

## 2018-07-28 ENCOUNTER — Other Ambulatory Visit: Payer: Self-pay

## 2018-07-28 MED ORDER — HYDROMORPHONE HCL 4 MG PO TABS: 4.0000 mg | ORAL_TABLET | Freq: Four times a day (QID) | ORAL | 0 refills | Status: DC | PRN

## 2018-07-28 NOTE — Telephone Encounter (Signed)
[place] Database Verified LR: 06-26-18. Qty: 30 Pending appt: 11-26-2018

## 2018-08-25 ENCOUNTER — Other Ambulatory Visit: Payer: Self-pay | Admitting: Neurology

## 2018-08-25 ENCOUNTER — Other Ambulatory Visit: Payer: Self-pay

## 2018-08-26 ENCOUNTER — Other Ambulatory Visit: Payer: Self-pay

## 2018-08-26 MED ORDER — HYDROMORPHONE HCL 4 MG PO TABS: 4.0000 mg | ORAL_TABLET | Freq: Four times a day (QID) | ORAL | 0 refills | Status: DC | PRN

## 2018-09-24 ENCOUNTER — Other Ambulatory Visit: Payer: Self-pay

## 2018-09-24 MED ORDER — HYDROMORPHONE HCL 4 MG PO TABS: 4.0000 mg | ORAL_TABLET | Freq: Four times a day (QID) | ORAL | 0 refills | Status: DC | PRN

## 2018-09-24 NOTE — Telephone Encounter (Signed)
[place] Database Verified LR: 08/26/2018 Qty: 30 Pending appointment: 11/26/2018

## 2018-10-06 ENCOUNTER — Other Ambulatory Visit: Payer: Self-pay | Admitting: Neurology

## 2018-10-06 DIAGNOSIS — I639 Cerebral infarction, unspecified: Principal | ICD-10-CM

## 2018-10-06 DIAGNOSIS — G43611 Persistent migraine aura with cerebral infarction, intractable, with status migrainosus: Secondary | ICD-10-CM

## 2018-10-13 ENCOUNTER — Other Ambulatory Visit: Payer: Self-pay | Admitting: Neurology

## 2018-10-13 MED ORDER — HYDROMORPHONE HCL 4 MG PO TABS: 4.0000 mg | ORAL_TABLET | Freq: Four times a day (QID) | ORAL | 0 refills | Status: DC | PRN

## 2018-10-14 ENCOUNTER — Telehealth: Payer: Self-pay | Admitting: Neurology

## 2018-10-14 NOTE — Telephone Encounter (Signed)
[name] in [place] calling to see if they can do early refill for Dilaudid that was received yesterday. Their records show that the patient has 8 days of medication left from last refill on 09/24/2018. Please call them at [phone]

## 2018-10-14 NOTE — Telephone Encounter (Signed)
I discussed verbally with Dr. Anne Hahn in regards to this message and he provided verbal ok for the patient to receive Dilaudid early. I contacted the [name] and spoke with tech Shala and provided verbal ok to refill medication today. She verbalized understanding and had no further questions.

## 2018-10-16 ENCOUNTER — Encounter: Payer: Self-pay | Admitting: Neurology

## 2018-10-16 ENCOUNTER — Ambulatory Visit: Payer: BLUE CROSS/BLUE SHIELD | Admitting: Neurology

## 2018-10-16 VITALS — BP 138/99 | HR 94 | Ht 67.0 in | Wt 165.0 lb

## 2018-10-16 DIAGNOSIS — G43711 Chronic migraine without aura, intractable, with status migrainosus: Secondary | ICD-10-CM | POA: Diagnosis not present

## 2018-10-16 MED ORDER — QUETIAPINE FUMARATE 50 MG PO TABS: 150.0000 mg | ORAL_TABLET | Freq: Every day | ORAL | 3 refills | Status: DC

## 2018-10-16 NOTE — Procedures (Signed)
   History: Tracey Ellison is a 28 year old patient with a history of intractable migraine headaches.  She has failed Aimovig, Botox, and multiple oral medications previously.  The patient is being brought in for nerve block procedures for headache.   Bupivicaine injection protocol for headache  Bupivacaine 0.5% was injected on the scalp bilaterally at several locations:  -On the occipital area of the head, 3 injections each side, 1 cc per injection at the midpoint between the mastoid process and the occipital protuberance. 2 other injections were done one finger breadth from the initial injection, one at a 10 o'clock position and the other at a 2 o'clock position.  -2 injections of 0.5 cc were done in the temporal regions, 2 fingerbreadths above the tragus of the ear, with the second injection one fingerbreadth posteriorly to the first.  -2 injections were done on the brow, 1 in the medial brow and one over the supraorbital nerve notch, with 0.1 cc for each injection  -1 injection each side of 0.5 cc was done anterior to the tragus of the ear for a trigeminal ganglion injection  The patient tolerated the injections well, no complications of the procedure were noted. Injections were made with a 27-gauge needle.  NDC number is [phone] [id number].  Lot number is [id number]. Expriation date is August 2021.

## 2018-10-16 NOTE — Progress Notes (Signed)
The patient comes in today for a nerve block procedure.  She has had ongoing severe headaches, she has been referred to Dr. Victorio Palm at [facility].  She will go up on the Seroquel taken 150 mg at night, this does help her sleep a bit better.  She will contact me if she believes that the nerve block procedure has been helpful, will repeat the procedure in 2 weeks if this is the case.

## 2018-10-27 ENCOUNTER — Other Ambulatory Visit: Payer: Self-pay | Admitting: Neurology

## 2018-10-27 MED ORDER — CYPROHEPTADINE HCL 4 MG PO TABS: 4.0000 mg | ORAL_TABLET | Freq: Three times a day (TID) | ORAL | 3 refills | Status: DC

## 2018-10-27 MED ORDER — QUETIAPINE FUMARATE 50 MG PO TABS: 100.0000 mg | ORAL_TABLET | Freq: Every day | ORAL | 3 refills | Status: DC

## 2018-10-31 ENCOUNTER — Other Ambulatory Visit: Payer: Self-pay | Admitting: Neurology

## 2018-10-31 MED ORDER — CYCLOBENZAPRINE HCL 10 MG PO TABS: 10.0000 mg | ORAL_TABLET | Freq: Every day | ORAL | 3 refills | Status: DC

## 2018-11-03 ENCOUNTER — Other Ambulatory Visit: Payer: Self-pay | Admitting: Neurology

## 2018-11-03 MED ORDER — HYDROMORPHONE HCL 4 MG PO TABS: 4.0000 mg | ORAL_TABLET | Freq: Four times a day (QID) | ORAL | 0 refills | Status: DC | PRN

## 2018-11-21 ENCOUNTER — Telehealth: Payer: Self-pay | Admitting: Neurology

## 2018-11-21 NOTE — Telephone Encounter (Signed)
Pt states that she is taking her Tizanidine 4mg  and now she is getting super restless and having anxiety once she falls asleep. She falls asleep quick but is awakened about 30-45 min later with theses symptoms. Please advise.

## 2018-11-22 ENCOUNTER — Other Ambulatory Visit: Payer: Self-pay | Admitting: Neurology

## 2018-11-23 ENCOUNTER — Other Ambulatory Visit: Payer: Self-pay | Admitting: Neurology

## 2018-11-24 ENCOUNTER — Other Ambulatory Visit: Payer: Self-pay | Admitting: Neurology

## 2018-11-24 ENCOUNTER — Telehealth: Payer: Self-pay

## 2018-11-24 MED ORDER — LORAZEPAM 0.5 MG PO TABS: 0.5000 mg | ORAL_TABLET | Freq: Every evening | ORAL | 1 refills | Status: DC | PRN

## 2018-11-24 NOTE — Telephone Encounter (Signed)
Patient had sent a my chart message on 11/24/18 regarding her 8 am f/u appt with Dr. Anne Hahn on 11/26/18. Pt was advised Due to current COVID 19 pandemic, our office is severely reducing in office visits for at least the next 2 weeks, in order to minimize the risk to our patients and healthcare providers.   Pt was offered a video visit for 11/26/18 and accepted.   Pt understands that although there may be some limitations with this type of visit, we will take all precautions to reduce any security or privacy concerns.  Pt understands that this will be treated like an in office visit and we will file with pt's insurance, and there may be a patient responsible charge related to this service.  Pt's email is [email or web address]. Pt understands that the cisco webex software must be downloaded and operational on the device pt plans to use for the visit.  I have reviewed the pt's medications, allergies and pharmacy.

## 2018-11-24 NOTE — Telephone Encounter (Signed)
Patient sent a my chart message on 11/21/18 with the same information. My chart message fwd to MD advising of message and requesting to address via my chart.

## 2018-11-26 ENCOUNTER — Ambulatory Visit (INDEPENDENT_AMBULATORY_CARE_PROVIDER_SITE_OTHER): Payer: BLUE CROSS/BLUE SHIELD | Admitting: Neurology

## 2018-11-26 ENCOUNTER — Other Ambulatory Visit: Payer: Self-pay | Admitting: Neurology

## 2018-11-26 ENCOUNTER — Encounter: Payer: Self-pay | Admitting: Neurology

## 2018-11-26 ENCOUNTER — Other Ambulatory Visit: Payer: Self-pay

## 2018-11-26 DIAGNOSIS — G43711 Chronic migraine without aura, intractable, with status migrainosus: Secondary | ICD-10-CM

## 2018-11-26 MED ORDER — PHENTERMINE HCL 15 MG PO CAPS: 15.0000 mg | ORAL_CAPSULE | ORAL | 1 refills | Status: DC

## 2018-11-26 NOTE — Progress Notes (Signed)
     Virtual Visit via Telephone Note  I connected with Tracey Ellison on 11/26/18 at  8:00 AM EDT by telephone and verified that I am speaking with the correct person using two identifiers.   I discussed the limitations, risks, security and privacy concerns of performing an evaluation and management service by telephone and the availability of in person appointments. I also discussed with the patient that there may be a patient responsible charge related to this service. The patient expressed understanding and agreed to proceed.   History of Present Illness: Tracey Ellison is a 28 year old right-handed white female with a history of intractable headaches.  The patient has been on a multitude of medications in the past without benefit.  She currently is on Seroquel for her headache but in part for chronic insomnia, she takes 150 mg at night with some benefit but this is resulting in some weight gain.  The patient has been on phentermine in the past.  The patient was seen by Dr. Victorio Palm for second opinion regarding the headache, tizanidine was started at 4 mg in the evening but she could not tolerate the medication secondary to anxiety increase and vivid dreams.  The patient has been on baclofen previously without benefit.  She was given a prescription for cyproheptadine but never took the medication.  She is on Ativan 0.5 mg twice daily.  Headaches remain daily in nature, she is having episodes that are severe and unable to work once a week or once every other week.  She is to get an FMLA form for [place] to fill out.   Observations/Objective: The patient is alert and cooperative on the WebEx evaluation.  She has good facial symmetry, full extraocular movements.  Finger-nose-finger is symmetric and normal.  Speech is well enunciated, not aphasic or dysarthric.  Assessment and Plan: 1.  Intractable migraine headache  2.  Anxiety  The patient will try the cyproheptadine 4 mg 3 times daily, if she  believes she is getting benefit she will need blood work on the medication within the next 3 months.  The patient will be continued on Seroquel, she will be given phentermine to prevent weight gain 15 mg daily.  She will follow-up here in about 5 months.  Follow Up Instructions: 48-month follow-up with me.   I discussed the assessment and treatment plan with the patient. The patient was provided an opportunity to ask questions and all were answered. The patient agreed with the plan and demonstrated an understanding of the instructions.   The patient was advised to call back or seek an in-person evaluation if the symptoms worsen or if the condition fails to improve as anticipated.  I provided 25 minutes of non-face-to-face time during this encounter.   York Spaniel, MD

## 2018-12-01 ENCOUNTER — Other Ambulatory Visit: Payer: Self-pay

## 2018-12-01 ENCOUNTER — Telehealth: Payer: Self-pay

## 2018-12-01 ENCOUNTER — Other Ambulatory Visit: Payer: Self-pay | Admitting: Neurology

## 2018-12-01 MED ORDER — HYDROMORPHONE HCL 4 MG PO TABS: 4.0000 mg | ORAL_TABLET | Freq: Four times a day (QID) | ORAL | 0 refills | Status: DC | PRN

## 2018-12-01 NOTE — Telephone Encounter (Signed)
See other refill encounter from today

## 2018-12-01 NOTE — Telephone Encounter (Signed)
Please review

## 2018-12-01 NOTE — Telephone Encounter (Signed)
Patient is requesting a refill for Dilaudid 4 mg. I verified the last refill and per [place] drug registry patient refilled on 11/04/18 # 30 for a 28 day supply.  Patient has been advised Dr. Anne Hahn is out of the office and refill may need to wait until he returns, but wanted message sent to covering MD.   Patient is being prescribed Dilaudid for migraine control.  Patient had a telephone visit with Dr. Anne Hahn on 11/26/18. Patient was advised to start cyproheptadine 4 mg 3 times per day but states she does not feel like this is helping.  Patient states her migraine pain over the weekend has been keeping her up at night and her anxiety has increased.

## 2018-12-01 NOTE — Progress Notes (Signed)
The dilaudid Rx was refilled

## 2018-12-01 NOTE — Telephone Encounter (Signed)
[place] Database Verified LR: 11/04/2018 Qty: 30 Pending appointment: 05/14/2019

## 2018-12-03 ENCOUNTER — Other Ambulatory Visit: Payer: Self-pay | Admitting: Neurology

## 2018-12-03 DIAGNOSIS — F3289 Other specified depressive episodes: Secondary | ICD-10-CM

## 2018-12-16 ENCOUNTER — Other Ambulatory Visit: Payer: Self-pay

## 2018-12-16 MED ORDER — QUETIAPINE FUMARATE 50 MG PO TABS: ORAL_TABLET | ORAL | 2 refills | Status: DC

## 2018-12-30 ENCOUNTER — Other Ambulatory Visit: Payer: Self-pay

## 2018-12-30 MED ORDER — HYDROMORPHONE HCL 4 MG PO TABS: 4.0000 mg | ORAL_TABLET | Freq: Four times a day (QID) | ORAL | 0 refills | Status: DC | PRN

## 2019-01-18 ENCOUNTER — Other Ambulatory Visit: Payer: Self-pay | Admitting: Neurology

## 2019-01-26 ENCOUNTER — Other Ambulatory Visit: Payer: Self-pay | Admitting: Neurology

## 2019-01-26 MED ORDER — HYDROMORPHONE HCL 4 MG PO TABS: 4.0000 mg | ORAL_TABLET | Freq: Four times a day (QID) | ORAL | 0 refills | Status: DC | PRN

## 2019-02-01 ENCOUNTER — Other Ambulatory Visit: Payer: Self-pay | Admitting: Neurology

## 2019-02-02 ENCOUNTER — Other Ambulatory Visit: Payer: Self-pay | Admitting: Neurology

## 2019-02-23 ENCOUNTER — Other Ambulatory Visit: Payer: Self-pay

## 2019-02-24 MED ORDER — HYDROMORPHONE HCL 4 MG PO TABS: 4.0000 mg | ORAL_TABLET | Freq: Four times a day (QID) | ORAL | 0 refills | Status: DC | PRN

## 2019-03-01 ENCOUNTER — Other Ambulatory Visit: Payer: Self-pay

## 2019-03-02 MED ORDER — DESVENLAFAXINE SUCCINATE ER 50 MG PO TB24: 50.0000 mg | ORAL_TABLET | Freq: Every day | ORAL | 3 refills | Status: DC

## 2019-03-03 ENCOUNTER — Other Ambulatory Visit: Payer: Self-pay | Admitting: Neurology

## 2019-03-03 DIAGNOSIS — G4486 Cervicogenic headache: Secondary | ICD-10-CM

## 2019-03-04 ENCOUNTER — Telehealth: Payer: Self-pay | Admitting: Neurology

## 2019-03-04 NOTE — Telephone Encounter (Signed)
LVM for pt to call me back about updating her insurance info.

## 2019-03-04 NOTE — Telephone Encounter (Signed)
Patient called back and informed me she has lost her job right now so she is self pay right now.   no to the covid-19 questions MR Cervical spine wo contrast Dr. Jannifer Franklin self pay. Patient is scheduled at [facility] for 03/10/19.

## 2019-03-10 ENCOUNTER — Telehealth: Payer: Self-pay | Admitting: Neurology

## 2019-03-10 ENCOUNTER — Ambulatory Visit: Payer: BC Managed Care – PPO

## 2019-03-10 ENCOUNTER — Other Ambulatory Visit: Payer: Self-pay

## 2019-03-10 DIAGNOSIS — R51 Headache: Secondary | ICD-10-CM | POA: Diagnosis not present

## 2019-03-10 DIAGNOSIS — G4486 Cervicogenic headache: Secondary | ICD-10-CM

## 2019-03-10 NOTE — Telephone Encounter (Signed)
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 In addition 4 mm axial slices from K5-5 down to T1-2 level were included with T2 and gradient echo views. CONTRAST: None IMAGING SITE: [facility], [address] [place], [place] [address]  FINDINGS: :  On sagittal images, the spine is imaged from above the cervicomedullary junction to T2.   The spinal cord is of normal caliber and signal.   The vertebral bodies are normally aligned.   The vertebral bodies have normal signal.  The discs and interspaces were further evaluated on axial views from C2 to T1 as follows: C2 - C3:  The disc and interspace appear normal. C3 - C4: There is minimal disc bulging seen best on the sagittal images and mild facet hypertrophy to the left.  There is no foraminal narrowing, spinal stenosis or nerve root compression.. C4 - C5:  There is minimal disc bulging seen best on the sagittal images and mild facet hypertrophy to the left.  There is no foraminal narrowing, spinal stenosis or nerve root compression.. C5 - C6:  There is minimal disc bulging seen best on the sagittal images.  There is no foraminal narrowing, spinal stenosis or nerve root compression... C6 - C7:  There is minimal disc bulging seen best on the sagittal images.  There is no foraminal narrowing, spinal stenosis or nerve root compression... C7 - T1:  The disc and interspace appear normal.  Compared to the MRI dated 04/11/2016, there is no interval change.  IMPRESSION:   This MRI of the cervical spine without contrast shows the following: 1.   There are small disc  bulges at C3-C4 through C6-C7, unchanged compared to the previous MRI.  There is no nerve root compression or spinal stenosis. 2.   Mild facet hypertrophy towards the left at C3-C4 and C4-C5. 3.   The spinal cord appears normal.     MRI of the cervical spine appears stable from 2017 study.

## 2019-03-18 ENCOUNTER — Other Ambulatory Visit: Payer: Self-pay | Admitting: Neurology

## 2019-03-19 NOTE — Telephone Encounter (Signed)
Received this mychart message from the pt: "[facility] said they have not received the copy of my MRI yet. Can you confirm that has been sent? To Dr. Alverda Skeans at [facility]."

## 2019-03-23 ENCOUNTER — Other Ambulatory Visit: Payer: Self-pay

## 2019-03-24 ENCOUNTER — Other Ambulatory Visit: Payer: Self-pay

## 2019-03-24 ENCOUNTER — Telehealth: Payer: Self-pay | Admitting: Neurology

## 2019-03-24 MED ORDER — HYDROMORPHONE HCL 4 MG PO TABS: 4.0000 mg | ORAL_TABLET | Freq: Four times a day (QID) | ORAL | 0 refills | Status: DC | PRN

## 2019-03-24 NOTE — Telephone Encounter (Signed)
Pt is needing a refill on her HYDROmorphone (DILAUDID) 4 MG tablet sent to the [facility] in [place].

## 2019-03-24 NOTE — Telephone Encounter (Signed)
Refill request was fwd this am to Dr. Jannifer Franklin. Waiting on him to review and sign if appropriate.

## 2019-04-21 ENCOUNTER — Other Ambulatory Visit: Payer: Self-pay

## 2019-04-21 MED ORDER — HYDROMORPHONE HCL 4 MG PO TABS: 4.0000 mg | ORAL_TABLET | Freq: Four times a day (QID) | ORAL | 0 refills | Status: DC | PRN

## 2019-04-21 NOTE — Telephone Encounter (Signed)
[place] Database Verified LR: 03-24-2019 Qty: 30 Pending appointment: 05-14-2019

## 2019-04-28 ENCOUNTER — Telehealth: Payer: Self-pay

## 2019-04-28 NOTE — Telephone Encounter (Signed)
Good morning,  Tracey Ellison has still not received my MRI copies. I'm not sure what the hold up is? It's been close to two months now. Can I please pick up a copy of it and mail it to them myself?

## 2019-05-14 ENCOUNTER — Encounter: Payer: Self-pay | Admitting: Neurology

## 2019-05-14 ENCOUNTER — Ambulatory Visit: Payer: BC Managed Care – PPO | Admitting: Neurology

## 2019-05-14 ENCOUNTER — Other Ambulatory Visit: Payer: Self-pay

## 2019-05-14 VITALS — BP 127/84 | HR 95 | Temp 97.3°F | Ht 68.0 in | Wt 173.4 lb

## 2019-05-14 DIAGNOSIS — Z79891 Long term (current) use of opiate analgesic: Secondary | ICD-10-CM

## 2019-05-14 DIAGNOSIS — G43711 Chronic migraine without aura, intractable, with status migrainosus: Secondary | ICD-10-CM

## 2019-05-14 NOTE — Progress Notes (Signed)
Reason for visit: Intractable migraine headache  Tracey Ellison is an 28 y.o. female  History of present illness:  Tracey Ellison is a 28 year old right-handed white female with a history of intractable headache, the patient continues to have headaches on a daily basis.  She is now working at [employer], she enjoys her work quite a bit which has helped her headaches some.  She is sleeping better at night, she is not as emotional as she had been in the past.  She continues Seroquel at night which helps her sleep.  She is missing work about 1 day a month.  She is followed through [facility] with Dr. Victorio Palm, she currently is on Emgality but will be switched to Ajovy in the near future.  She has used Reyvow with some benefit, but she feels spacey when she takes it and she is unable to drive for 8 hours after taking the dose.  The patient has not yet found a fully effective treatment for her headache.  Past Medical History:  Diagnosis Date  . Chronic headaches   . Chronic insomnia 05/20/2018  . Intractable chronic migraine without aura 10/26/2014  . Raynauds phenomenon     Past Surgical History:  Procedure Laterality Date  . WISDOM TOOTH EXTRACTION      Family History  Problem Relation Age of Onset  . Hypertension Father   . Leukemia Neg Hx        Hx. of leukemia on father's side of family  . Lung cancer Neg Hx        Hx. of lung cancer on father's side of family  . Breast cancer Neg Hx        Hx. of breast cancer on father's side of family  . Cancer Neg Hx        Hx. of intestinal cancer on father's side of family    Social history:  reports that she has never smoked. She has never used smokeless tobacco. She reports that she does not drink alcohol or use drugs.    Allergies  Allergen Reactions  . Metoclopramide Hcl Anaphylaxis    hives  . Amitriptyline Other (See Comments)    Dizzy, anxiety, night terrors  . Decadron [Dexamethasone]     anxiety    Medications:  Prior  to Admission medications   Medication Sig Start Date End Date Taking? Authorizing Provider  desvenlafaxine (PRISTIQ) 50 MG 24 hr tablet Take 1 tablet (50 mg total) by mouth daily. 03/02/19  Yes York Spaniel, MD  HYDROmorphone (DILAUDID) 4 MG tablet Take 1 tablet (4 mg total) by mouth every 6 (six) hours as needed for severe pain. Must last 28 days 04/21/19  Yes York Spaniel, MD  LORazepam (ATIVAN) 0.5 MG tablet Take 1 tablet (0.5 mg total) by mouth at bedtime as needed for anxiety. 11/24/18  Yes York Spaniel, MD  norethindrone-ethinyl estradiol (JUNEL FE,GILDESS FE,LOESTRIN FE) 1-20 MG-MCG tablet Take by mouth.   Yes [provider]  ondansetron (ZOFRAN) 4 MG tablet Take 1 tablet (4 mg total) by mouth every 8 (eight) hours as needed for nausea or vomiting. 04/10/17  Yes York Spaniel, MD  QUEtiapine (SEROQUEL) 50 MG tablet TAKE THREE TABLETS BY MOUTH EVERY NIGHT AT BEDTIME 03/19/19  Yes York Spaniel, MD  SYNTHROID 50 MCG tablet Take 50 mcg by mouth daily. 10/01/13  Yes [provider]  ketorolac (TORADOL) 10 MG tablet Take 1 tablet (10 mg total) by mouth every  8 (eight) hours as needed. Patient not taking: Reported on 05/14/2019 03/31/18   Kathrynn Ducking, MD    ROS:  Out of a complete 14 system review of symptoms, the patient complains only of the following symptoms, and all other reviewed systems are negative.  Headache  Blood pressure 127/84, pulse 95, temperature (!) 97.3 F (36.3 C), temperature source Temporal, height 5\' 8"  (1.727 m), weight 173 lb 6 oz (78.6 kg).  Physical Exam  General: The patient is alert and cooperative at the time of the examination.  Skin: No significant peripheral edema is noted.   Neurologic Exam  Mental status: The patient is alert and oriented x 3 at the time of the examination. The patient has apparent normal recent and remote memory, with an apparently normal attention span and concentration ability.   Cranial  nerves: Facial symmetry is present. Speech is normal, no aphasia or dysarthria is noted. Extraocular movements are full. Visual fields are full.  Motor: The patient has good strength in all 4 extremities.  Sensory examination: Soft touch sensation is symmetric on the face, arms, and legs.  Coordination: The patient has good finger-nose-finger and heel-to-shin bilaterally.  Gait and station: The patient has a normal gait. Tandem gait is normal. Romberg is negative. No drift is seen.  Reflexes: Deep tendon reflexes are symmetric.   MRI cervical 03/10/19:  IMPRESSION:   This MRI of the cervical spine without contrast shows the following: 1.   There are small disc bulges at C3-C4 through C6-C7, unchanged compared to the previous MRI.  There is no nerve root compression or spinal stenosis. 2.   Mild facet hypertrophy towards the left at C3-C4 and C4-C5. 3.   The spinal cord appears normal.  * MRI scan images were reviewed online. I agree with the written report.     Assessment/Plan:  1.  Chronic daily headache  2.  Chronic opiate use  I discussed the possibility of a referral to a pain center as I am going part-time in November 2020.  The patient is amenable to this, we will check a urine drug screen today.  She will continue her other medications to include Seroquel and Pristiq which seemed to help.  She is now much happier with her job situation and emotionally seems to be better adjusted today.  She will follow-up here in 6 months.  [name] MD 05/14/2019 7:57 AM  [facility] [address] [place], [place] [address]  Phone [phone] Fax [phone]

## 2019-05-17 ENCOUNTER — Other Ambulatory Visit: Payer: Self-pay | Admitting: Neurology

## 2019-05-18 ENCOUNTER — Other Ambulatory Visit: Payer: Self-pay

## 2019-05-19 MED ORDER — HYDROMORPHONE HCL 4 MG PO TABS: 4.0000 mg | ORAL_TABLET | Freq: Four times a day (QID) | ORAL | 0 refills | Status: DC | PRN

## 2019-05-20 LAB — COMPLIANCE DRUG ANALYSIS, UR

## 2019-06-03 ENCOUNTER — Telehealth: Payer: Self-pay | Admitting: Neurology

## 2019-06-03 NOTE — Telephone Encounter (Signed)
Patient was scheduled 05/25/2019 with Dr. Asencion Partridge at 2:30 pm [facility] .

## 2019-06-11 ENCOUNTER — Other Ambulatory Visit: Payer: Self-pay | Admitting: Neurology

## 2019-06-11 MED ORDER — KETOROLAC TROMETHAMINE 10 MG PO TABS: 10.0000 mg | ORAL_TABLET | Freq: Four times a day (QID) | ORAL | 0 refills | Status: DC | PRN

## 2019-06-16 ENCOUNTER — Other Ambulatory Visit: Payer: Self-pay | Admitting: Neurology

## 2019-06-30 ENCOUNTER — Other Ambulatory Visit: Payer: Self-pay | Admitting: Neurology

## 2019-07-28 ENCOUNTER — Other Ambulatory Visit: Payer: Self-pay | Admitting: Neurology

## 2019-11-16 ENCOUNTER — Ambulatory Visit: Payer: BC Managed Care – PPO | Admitting: Neurology

## 2019-12-07 ENCOUNTER — Other Ambulatory Visit: Payer: Self-pay

## 2019-12-07 MED ORDER — ONDANSETRON HCL 4 MG PO TABS: 4.0000 mg | ORAL_TABLET | Freq: Three times a day (TID) | ORAL | 0 refills | Status: DC | PRN

## 2019-12-17 ENCOUNTER — Other Ambulatory Visit: Payer: Self-pay | Admitting: Neurology

## 2020-02-01 ENCOUNTER — Other Ambulatory Visit: Payer: Self-pay

## 2020-02-01 MED ORDER — LORAZEPAM 0.5 MG PO TABS: ORAL_TABLET | ORAL | 0 refills | Status: DC

## 2020-02-01 NOTE — Telephone Encounter (Signed)
Drug registry check last refill 07/2019.

## 2020-03-07 ENCOUNTER — Other Ambulatory Visit: Payer: Self-pay | Admitting: Neurology

## 2020-03-07 MED ORDER — KETOROLAC TROMETHAMINE 10 MG PO TABS: 10.0000 mg | ORAL_TABLET | Freq: Four times a day (QID) | ORAL | 3 refills | Status: DC | PRN

## 2020-03-07 MED ORDER — LORAZEPAM 0.5 MG PO TABS: ORAL_TABLET | ORAL | 3 refills | Status: DC

## 2020-03-08 ENCOUNTER — Other Ambulatory Visit: Payer: Self-pay | Admitting: Neurology

## 2020-03-24 ENCOUNTER — Other Ambulatory Visit: Payer: Self-pay | Admitting: Neurology

## 2020-04-14 ENCOUNTER — Other Ambulatory Visit: Payer: Self-pay

## 2020-04-14 ENCOUNTER — Encounter (HOSPITAL_BASED_OUTPATIENT_CLINIC_OR_DEPARTMENT_OTHER): Payer: Self-pay | Admitting: Emergency Medicine

## 2020-04-14 ENCOUNTER — Emergency Department (HOSPITAL_BASED_OUTPATIENT_CLINIC_OR_DEPARTMENT_OTHER)
Admission: EM | Admit: 2020-04-14 | Discharge: 2020-04-14 | Disposition: A | Discharge status: Home / Self Care | Payer: BC Managed Care – PPO | Attending: Emergency Medicine | Admitting: Emergency Medicine

## 2020-04-14 ENCOUNTER — Emergency Department (HOSPITAL_BASED_OUTPATIENT_CLINIC_OR_DEPARTMENT_OTHER): Payer: BC Managed Care – PPO

## 2020-04-14 DIAGNOSIS — T7840XA Allergy, unspecified, initial encounter: Secondary | ICD-10-CM | POA: Diagnosis not present

## 2020-04-14 DIAGNOSIS — R0602 Shortness of breath: Secondary | ICD-10-CM | POA: Diagnosis present

## 2020-04-14 DIAGNOSIS — R519 Headache, unspecified: Secondary | ICD-10-CM | POA: Insufficient documentation

## 2020-04-14 DIAGNOSIS — Z789 Other specified health status: Secondary | ICD-10-CM

## 2020-04-14 DIAGNOSIS — E079 Disorder of thyroid, unspecified: Secondary | ICD-10-CM | POA: Insufficient documentation

## 2020-04-14 DIAGNOSIS — R0789 Other chest pain: Secondary | ICD-10-CM | POA: Insufficient documentation

## 2020-04-14 DIAGNOSIS — T402X5A Adverse effect of other opioids, initial encounter: Secondary | ICD-10-CM | POA: Insufficient documentation

## 2020-04-14 HISTORY — DX: Disorder of thyroid, unspecified: E07.9

## 2020-04-14 LAB — CBC
HCT: 38.1 % (ref 36.0–46.0)
Hemoglobin: 12.9 g/dL (ref 12.0–15.0)
MCH: 29.5 pg (ref 26.0–34.0)
MCHC: 33.9 g/dL (ref 30.0–36.0)
MCV: 87 fL (ref 80.0–100.0)
Platelets: 328 10*3/uL (ref 150–400)
RBC: 4.38 MIL/uL (ref 3.87–5.11)
RDW: 13.4 % (ref 11.5–15.5)
WBC: 7.2 10*3/uL (ref 4.0–10.5)
nRBC: 0 % (ref 0.0–0.2)

## 2020-04-14 LAB — TROPONIN I (HIGH SENSITIVITY)
Troponin I (High Sensitivity): 2 ng/L (ref <18)
Troponin I (High Sensitivity): <2 ng/L (ref <18)

## 2020-04-14 LAB — BASIC METABOLIC PANEL
Anion gap: 9 (ref 5–15)
BUN: 9 mg/dL (ref 6–20)
CO2: 20 mmol/L — ABNORMAL LOW (ref 22–32)
Calcium: 8.4 mg/dL — ABNORMAL LOW (ref 8.9–10.3)
Chloride: 108 mmol/L (ref 98–111)
Creatinine, Ser: 0.8 mg/dL (ref 0.44–1.00)
GFR calc Af Amer: >60 mL/min (ref >60)
GFR calc non Af Amer: >60 mL/min (ref >60)
Glucose, Bld: 109 mg/dL — ABNORMAL HIGH (ref 70–99)
Potassium: 3.3 mmol/L — ABNORMAL LOW (ref 3.5–5.1)
Sodium: 137 mmol/L (ref 135–145)

## 2020-04-14 MED ORDER — SODIUM CHLORIDE 0.9 % IV BOLUS
500.0000 mL | Freq: Once | INTRAVENOUS | Status: AC
  Administered 2020-04-14: 500 mL via INTRAVENOUS

## 2020-04-14 MED ORDER — OXYCODONE-ACETAMINOPHEN 5-325 MG PO TABS
1.0000 | ORAL_TABLET | Freq: Once | ORAL | Status: AC
  Administered 2020-04-14: 1 via ORAL
  Filled 2020-04-14: qty 1

## 2020-04-14 MED ORDER — LORAZEPAM 2 MG/ML IJ SOLN
1.0000 mg | Freq: Once | INTRAMUSCULAR | Status: AC
  Administered 2020-04-14: 1 mg via INTRAVENOUS
  Filled 2020-04-14: qty 1

## 2020-04-14 NOTE — ED Triage Notes (Signed)
SOB this morning. Started suboxone yesterday and feels like this is an allergic reaction. States she feels like her throat is swelling.

## 2020-04-14 NOTE — ED Notes (Signed)
Pt on monitor 

## 2020-04-14 NOTE — Discharge Instructions (Addendum)
You were seen in the ED for possible intolerance to Suboxone  Please make appointment with pain management provider and holistic doctor to come up with a good plan moving forward  Return to the ED for facial lip tongue or throat swelling, difficulty breathing, rash all over your body

## 2020-04-14 NOTE — ED Notes (Signed)
Take dilaudid frequently for Migraine, now on suboxne, having SOB, chest discomfort, lip feel full and swollen, arms tingling

## 2020-04-14 NOTE — ED Notes (Signed)
Last dose of Suboxone was Tuesday am, Wed pm began having previously mentioned complaints, states this also occurred 2 weeks ago

## 2020-04-14 NOTE — ED Notes (Signed)
DC instructions along with the AVS provided to pt and mother, opportunity for questions provided

## 2020-04-14 NOTE — ED Notes (Signed)
Done previously by another person

## 2020-04-14 NOTE — ED Provider Notes (Signed)
[facility] EMERGENCY DEPARTMENT Provider Note   CSN: [id number] Arrival date & time: 04/14/20  1153     History Chief Complaint  Patient presents with  . Shortness of Breath  . Allergic Reaction    Tracey Ellison is a 29 y.o. female with past medical history of headaches presents to the ED for evaluation of possible allergic reaction to Suboxone.  Patient reports she has had daily headaches for the last 11 years.  Has seen several neurologists, last Dr. Jannifer Franklin.  Dr Jannifer Franklin recently referred to pain management and now see Ronette Deter at [facility].  Has been prescribed Dilaudid for headaches as she states it is the only thing that "takes the edge off".  This is prescribed by Ronette Deter, PA-C at [facility] pain management.  States recently she went to a holistic doctor to try to cut back on Dilaudid.  The goal is to taper off Dilaudid with suboxone.  She has started taking supplements to try to heal from the inside out eventually the goal is to take low-dose naltrexone and stop taking Dilaudid.  States yesterday evening she took Suboxone.  She has 8-2 suboxone strips and she cut these into quarters.  She took one quarter yesterday morning and another quarter 1 yesterday evening.  She woke up this morning and after eating breakfast reports feeling mouth numbness, tingling and chest warmth.  She also had tingling in her arms.  Denies frank chest pain but she feels like taking a deep breath is hard because it feels like there is some resistance.  It also feels warm in her chest.  She has prescribed Narcan and reportedly used 4 mg intranasally at 11 AM today.  Feels slightly better than this morning but still feels like she cannot take a full deep breath due to the chest tightness.  She had a similar reaction 2 weeks ago and went to [facility] ED.  Reports being displeased with the treatment there.  Had an appointment with her pain management provider [facility] today at 1  PM but instead came to the ED.  She has an appointment with Dr. Jannifer Franklin neurology on Monday. States pain management gave her a 10 day prescription for Dilaudid, ending today.   Denies facial lip tongue or throat swelling. No cough, fever. No vomiting, diarrhea, abdominal pain. No rash. Known allergy to reglan - hives.   HPI     Past Medical History:  Diagnosis Date  . Chronic headaches   . Chronic insomnia 05/20/2018  . Intractable chronic migraine without aura 10/26/2014  . Raynauds phenomenon   . Thyroid disease     Patient Active Problem List   Diagnosis Date Noted  . Chronic insomnia 05/20/2018  . Panic disorder 08/24/2015  . Intractable chronic migraine without aura 10/26/2014  . Sprain of neck 05/29/2012  . Headache 05/29/2012    Past Surgical History:  Procedure Laterality Date  . WISDOM TOOTH EXTRACTION       OB History   No obstetric history on file.     Family History  Problem Relation Age of Onset  . Hypertension Father   . Leukemia Neg Hx        Hx. of leukemia on father's side of family  . Lung cancer Neg Hx        Hx. of lung cancer on father's side of family  . Breast cancer Neg Hx        Hx. of breast cancer on father's  side of family  . Cancer Neg Hx        Hx. of intestinal cancer on father's side of family    Social History   Tobacco Use  . Smoking status: Never Smoker  . Smokeless tobacco: Never Used  Substance Use Topics  . Alcohol use: No  . Drug use: No    Home Medications Prior to Admission medications   Medication Sig Start Date End Date Taking? Authorizing Provider  desvenlafaxine (PRISTIQ) 50 MG 24 hr tablet TAKE ONE TABLET BY MOUTH DAILY Patient taking differently: Take 50 mg by mouth daily.  03/24/20  Yes Kathrynn Ducking, MD  [name] 1-20 MG-MCG(24) tablet Take 1 tablet by mouth daily. 03/11/20  Yes [provider]  HYDROmorphone (DILAUDID) 4 MG tablet Take 1 tablet (4 mg total) by mouth every 6 (six) hours as  needed for severe pain. Must last 28 days 05/19/19  Yes Kathrynn Ducking, MD  hydrOXYzine (VISTARIL) 25 MG capsule Take 25 mg by mouth daily as needed for itching. 04/01/20  Yes [provider]  ketorolac (TORADOL) 10 MG tablet Take 1 tablet (10 mg total) by mouth every 6 (six) hours as needed. Patient taking differently: Take 10 mg by mouth every 6 (six) hours as needed for moderate pain.  03/07/20  Yes Kathrynn Ducking, MD  LORazepam (ATIVAN) 0.5 MG tablet TAKE ONE TABLET BY MOUTH EVERY NIGHT AT BEDTIME AS NEEDED FOR ANXIETY Patient taking differently: Take 0.5 mg by mouth at bedtime as needed for anxiety.  03/07/20  Yes Kathrynn Ducking, MD  [facility] 4 MG/0.1ML LIQD nasal spray kit Place 1 spray into the nose once.  04/04/20  Yes [provider]  NP THYROID 90 MG tablet Take 90 mg by mouth daily. 04/07/20  Yes [provider]  ondansetron (ZOFRAN) 4 MG tablet Take 1 tablet (4 mg total) by mouth every 8 (eight) hours as needed for nausea or vomiting. 12/07/19  Yes Melvenia Beam, MD  promethazine (PHENERGAN) 12.5 MG tablet Take 12.5 mg by mouth daily as needed for nausea/vomiting.   Yes [provider]  QUEtiapine (SEROQUEL) 50 MG tablet TAKE THREE TABLETS BY MOUTH EVERY NIGHT AT BEDTIME Patient taking differently: Take 150 mg by mouth at bedtime.  12/18/19  Yes Kathrynn Ducking, MD  tiZANidine (ZANAFLEX) 4 MG tablet Take 4 mg by mouth 2 (two) times daily. 03/28/20  Yes [provider]  buprenorphine-naloxone (SUBOXONE) 8-2 mg SUBL SL tablet Place 1 tablet under the tongue daily.    [provider]    Allergies    Metoclopramide hcl, Amitriptyline, Decadron [dexamethasone], and Suboxone [buprenorphine hcl-naloxone hcl]  Review of Systems   Review of Systems  Respiratory: Positive for chest tightness and shortness of breath.   Neurological:       Tingling arms and lip   All other systems reviewed and are negative.   Physical Exam Updated  Vital Signs BP (!) 149/109 (BP Location: Left Arm)   Pulse 93   Temp 98.1 F (36.7 C) (Oral)   Resp 12   Ht '5\' 7"'  (1.702 m)   Wt 77.1 kg   LMP 03/30/2020   SpO2 99%   BMI 26.63 kg/m   Physical Exam Vitals and nursing note reviewed.  Constitutional:      Appearance: She is well-developed.     Comments: Non toxic in NAD  HENT:     Head: Normocephalic and atraumatic.     Nose: Nose normal.  Mouth/Throat:     Comments: No obvious facial, lip tongue or oropharyngeal swelling.  Patient speaking in full sentences. Eyes:     Conjunctiva/sclera: Conjunctivae normal.  Cardiovascular:     Rate and Rhythm: Normal rate and regular rhythm.  Pulmonary:     Effort: Pulmonary effort is normal.     Breath sounds: Normal breath sounds.     Comments: Breathing is even and unlabored.  Lungs clear. Abdominal:     General: Bowel sounds are normal.     Palpations: Abdomen is soft.     Tenderness: There is no abdominal tenderness.     Comments: No G/R/R. No suprapubic or CVA tenderness. Negative Murphy's and McBurney's. Active BS to lower quadrants.   Musculoskeletal:        General: Normal range of motion.     Cervical back: Normal range of motion.  Skin:    General: Skin is warm and dry.     Capillary Refill: Capillary refill takes less than 2 seconds.  Neurological:     Mental Status: She is alert.  Psychiatric:        Behavior: Behavior normal.     ED Results / Procedures / Treatments   Labs (all labs ordered are listed, but only abnormal results are displayed) Labs Reviewed  BASIC METABOLIC PANEL - Abnormal; Notable for the following components:      Result Value   Potassium 3.3 (*)    CO2 20 (*)    Glucose, Bld 109 (*)    Calcium 8.4 (*)    All other components within normal limits  CBC  TROPONIN I (HIGH SENSITIVITY)  TROPONIN I (HIGH SENSITIVITY)    EKG EKG Interpretation  Date/Time:  Thursday April 14 2020 12:09:54 EDT Ventricular Rate:  99 PR Interval:     QRS Duration: 88 QT Interval:  354 QTC Calculation: 455 R Axis:   87 Text Interpretation: Sinus rhythm Confirmed by Lennice Sites [phone]) on 04/14/2020 12:14:14 PM   Radiology DG Chest Port 1 View  Result Date: 04/14/2020 CLINICAL DATA:  Chest tightness. EXAM: PORTABLE CHEST 1 VIEW COMPARISON:  01/29/2014 FINDINGS: The heart size and mediastinal contours are within normal limits. Both lungs are clear. No pleural effusion. No discernible pneumothorax. The visualized skeletal structures are unremarkable. IMPRESSION: No acute cardiopulmonary disease. Electronically Signed   By: Margaretha Sheffield MD   On: 04/14/2020 13:33    Procedures Procedures (including critical care time)  Medications Ordered in ED Medications  oxyCODONE-acetaminophen (PERCOCET/ROXICET) 5-325 MG per tablet 1 tablet (has no administration in time range)  LORazepam (ATIVAN) injection 1 mg (1 mg Intravenous Given 04/14/20 1319)  sodium chloride 0.9 % bolus 500 mL (0 mLs Intravenous Stopped 04/14/20 1415)    ED Course  I have reviewed the triage vital signs and the nursing notes.  Pertinent labs & imaging results that were available during my care of the patient were reviewed by me and considered in my medical decision making (see chart for details).    MDM Rules/Calculators/A&P                          29 year old female presents for concern of allergic reaction to Suboxone.  Chronic headaches treated with Dilaudid by pain management at [facility]  Currently tapering off Dilaudid with Suboxone.  History of anxiety treated with ativan.   Her symptoms include tingling in her mouth, arms and chest warmth.  No frank shortness of breath or chest  pain but feels like she is breathing against resistance.  Similar symptoms on 8/12 where she went to an outside ED.  Available medical records, triage nursing notes reviewed to assist with history and MDM.  I have reviewed ED visit at [facility] health 8/12.  She presented  with similar symptoms at that time including tingling in her body, shortness of breath.  Lab work at that time was benign.  Found to be hypertensive there.  I have ordered lab work including troponin, EKG, chest x-ray.  I have ordered IV fluids, Ativan.  Patient reevaluated and feels slightly better in terms of her tingling and chest warmth.  Now reports increasing headache and requesting something for pain.  I reviewed narcotic database, she had a prescription for 10 days of Dilaudid ending today.  I will give 1 Percocet here.  Will defer further medication changes and prescriptions to her pain management provider.    I called and spoke to Ronette Deter at [facility], patient's pain management provider.  Patient had an appointment today at 1 PM but came to the ED for evaluation.  Asencion Partridge does not provide any other specific changes in ED management or disposition.  Encourages patient to follow-up in the clinic.  Patient has appointment with neurology on Monday.  Patient has been observed in the ED for several hours with improvement in her symptoms.  Has been slightly hypertensive that I think is secondary to current Dilaudid/Suboxone taper, possibly withdrawal, pain response.  Her blood pressure and tachycardia improved after IV fluids and Ativan.  ER work up personally visualized and interpreted. EKG witohut ischemic changes or RV strain. CXR clear. Labs benign. Trop undetectable.  HEART score less than 3.  No need for repeat trop.   Ddx includes symptoms secondary to medication/suboxone intolerance, withdrawal symptoms from current taper off Dilaudid.  No indication today to suggest allergic reaction or anaphylaxis.  No hemodynamic instability.  No facial or oral angioedema.  No vomiting or diarrhea.  No fever, cough to suggest infections symptoms to cause CP, SOB.  Doubt PE in this setting. No concern for ACS.  Appropriate for discharge.  Return precautions discussed.  Patient and mother  are comfortable with the plan.  Final Clinical Impression(s) / ED Diagnoses Final diagnoses:  Medication intolerance    Rx / DC Orders ED Discharge Orders    None       Kinnie Feil, PA-C 04/14/20 [place], [place], DO 04/14/20 1538

## 2020-04-18 ENCOUNTER — Ambulatory Visit (INDEPENDENT_AMBULATORY_CARE_PROVIDER_SITE_OTHER): Payer: BC Managed Care – PPO | Admitting: Neurology

## 2020-04-18 ENCOUNTER — Encounter: Payer: Self-pay | Admitting: Neurology

## 2020-04-18 VITALS — BP 146/106 | HR 98 | Ht 70.0 in | Wt 170.0 lb

## 2020-04-18 DIAGNOSIS — G43711 Chronic migraine without aura, intractable, with status migrainosus: Secondary | ICD-10-CM

## 2020-04-18 DIAGNOSIS — F410 Panic disorder [episodic paroxysmal anxiety] without agoraphobia: Secondary | ICD-10-CM | POA: Diagnosis not present

## 2020-04-18 NOTE — Progress Notes (Signed)
Reason for visit: Chronic daily headache  Tracey Ellison is an 29 y.o. female  History of present illness:  Tracey Ellison is a 29 year old right-handed white female with a history of chronic daily headaches.  The patient currently is followed through the [facility] with Dr. Brett Ellison.  She was getting 30 Dilaudid tablets a month through this office to take as needed for her headache, she apparently now is getting 120 tablets a month.  The patient is still having ongoing daily headaches, she is wanting to come off of the Dilaudid as she realizes that she is now addicted to it.  She was switched over to Suboxone but did not tolerate the drug.  She feels that she is not getting better with her headache.  The patient has gone through 3 different jobs in the last year, she recently lost her job in July 2021 in part because of her headaches.  The patient denies any nausea or vomiting, she denies any photophobia or phonophobia but has a persistent discomfort in the back of the head at the base of the skull unassociated with neck stiffness.  The headaches tend to be a bit worse in the evenings.  The patient is not sleeping well even on Seroquel 150 mg at night.  She returns to this office for further evaluation.  In the past, she gained no benefit from Ajovy or Emgality.  She has gained some benefit from Botox in the past.  She has been in the emergency room on 2 occasions recently in part related to what was felt to be anxiety symptoms and some medication reaction to the Suboxone.  She has had episodes where she feels like she cannot breathe and her throat is tight.  Past Medical History:  Diagnosis Date  . Chronic headaches   . Chronic insomnia 05/20/2018  . Intractable chronic migraine without aura 10/26/2014  . Raynauds phenomenon   . Thyroid disease     Past Surgical History:  Procedure Laterality Date  . WISDOM TOOTH EXTRACTION      Family History  Problem Relation Age of Onset  .  Hypertension Father   . Leukemia Neg Hx        Hx. of leukemia on father's side of family  . Lung cancer Neg Hx        Hx. of lung cancer on father's side of family  . Breast cancer Neg Hx        Hx. of breast cancer on father's side of family  . Cancer Neg Hx        Hx. of intestinal cancer on father's side of family    Social history:  reports that she has never smoked. She has never used smokeless tobacco. She reports that she does not drink alcohol and does not use drugs.    Allergies  Allergen Reactions  . Metoclopramide Hcl Anaphylaxis    hives  . Amitriptyline Other (See Comments)    Dizzy, anxiety, night terrors  . Decadron [Dexamethasone]     anxiety  . Suboxone [Buprenorphine Hcl-Naloxone Hcl] Other (See Comments)    Chest tightness, trouble breathing, mouth swelling.     Medications:  Prior to Admission medications   Medication Sig Start Date End Date Taking? Authorizing Provider  buprenorphine-naloxone (SUBOXONE) 8-2 mg SUBL SL tablet Place 1 tablet under the tongue daily.    [provider]  desvenlafaxine (PRISTIQ) 50 MG 24 hr tablet TAKE ONE TABLET BY MOUTH DAILY Patient taking differently:  Take 50 mg by mouth daily.  03/24/20   Tracey Ducking, MD  Tracey Ellison 24 FE 1-20 MG-MCG(24) tablet Take 1 tablet by mouth daily. 03/11/20   [provider]  HYDROmorphone (DILAUDID) 4 MG tablet Take 1 tablet (4 mg total) by mouth every 6 (six) hours as needed for severe pain. Must last 28 days 05/19/19   Tracey Ducking, MD  hydrOXYzine (VISTARIL) 25 MG capsule Take 25 mg by mouth daily as needed for itching. 04/01/20   [provider]  ketorolac (TORADOL) 10 MG tablet Take 1 tablet (10 mg total) by mouth every 6 (six) hours as needed. Patient taking differently: Take 10 mg by mouth every 6 (six) hours as needed for moderate pain.  03/07/20   Tracey Ducking, MD  LORazepam (ATIVAN) 0.5 MG tablet TAKE ONE TABLET BY MOUTH EVERY NIGHT AT BEDTIME AS NEEDED  FOR ANXIETY Patient taking differently: Take 0.5 mg by mouth at bedtime as needed for anxiety.  03/07/20   Tracey Ducking, MD  NARCAN 4 MG/0.1ML LIQD nasal spray kit Place 1 spray into the nose once.  04/04/20   [provider]  NP THYROID 90 MG tablet Take 90 mg by mouth daily. 04/07/20   [provider]  ondansetron (ZOFRAN) 4 MG tablet Take 1 tablet (4 mg total) by mouth every 8 (eight) hours as needed for nausea or vomiting. 12/07/19   Melvenia Beam, MD  promethazine (PHENERGAN) 12.5 MG tablet Take 12.5 mg by mouth daily as needed for nausea/vomiting.    [provider]  QUEtiapine (SEROQUEL) 50 MG tablet TAKE THREE TABLETS BY MOUTH EVERY NIGHT AT BEDTIME Patient taking differently: Take 150 mg by mouth at bedtime.  12/18/19   Tracey Ducking, MD  tiZANidine (ZANAFLEX) 4 MG tablet Take 4 mg by mouth 2 (two) times daily. 03/28/20   [provider]    ROS:  Out of a complete 14 system review of symptoms, the patient complains only of the following symptoms, and all other reviewed systems are negative.  Headache Anxiety  Blood pressure (!) 146/106, pulse 98, height '5\' 10"'  (1.778 m), weight 170 lb (77.1 kg), last menstrual period 03/30/2020.  Physical Exam  General: The patient is alert and cooperative at the time of the examination.   Neuromuscular: Range move the cervical spine is full.  Skin: No significant peripheral edema is noted.   Neurologic Exam  Mental status: The patient is alert and oriented x 3 at the time of the examination. The patient has apparent normal recent and remote memory, with an apparently normal attention span and concentration ability.   Cranial nerves: Facial symmetry is present. Speech is normal, no aphasia or dysarthria is noted. Extraocular movements are full. Visual fields are full.  Motor: The patient has good strength in all 4 extremities.  Sensory examination: Soft touch sensation is symmetric on the face,  arms, and legs.  Coordination: The patient has good finger-nose-finger and heel-to-Ellison bilaterally.  Gait and station: The patient has a normal gait. Tandem gait is normal. Romberg is negative. No drift is seen.  Reflexes: Deep tendon reflexes are symmetric.   Assessment/Plan:  1.  Intractable headache  2.  Chronic opioid use  The patient wants to come off of the Dilaudid.  I have recommended a tapering schedule of going down by 1/2 tablet every 6 weeks until she stops the medication.  She will continue the Seroquel at night, I will reinstitute the Botox therapies as this  in the past has been the only thing that seems to have some impact on her headache.  She will follow up through this office in about 4 months.  [name] MD 04/18/2020 6:50 PM  [facility] [address] [place], [place] [address]  Phone [phone] Fax [phone]

## 2020-04-20 ENCOUNTER — Encounter (HOSPITAL_COMMUNITY): Payer: Self-pay | Admitting: Psychiatry

## 2020-04-20 ENCOUNTER — Ambulatory Visit (HOSPITAL_COMMUNITY)
Admission: RE | Admit: 2020-04-20 | Discharge: 2020-04-20 | Disposition: A | Discharge status: Home / Self Care | Payer: BC Managed Care – PPO | Attending: Psychiatry | Admitting: Psychiatry

## 2020-04-20 NOTE — BH Assessment (Signed)
Assessment Note  Tracey Ellison is a 29 y.o. female who presented to [facility] as a voluntary walk in (accompanied by mother Tracey Ellison -- [phone]) with request for detox services due to daily use of opioids (Dilaudid).  Pt lives alone in [place], and she is unemployed.  Pt does not have a psychiatrist or therapist.  Pt receives psychotropic medication for treatment of depressive and anxiety symptoms through her neurologist Dr. Stephanie Acre.    Pt reported that she is addicted to her Dialaud, and she requested detox services.  Pt indicated that she has migraines and is prescribed medication through her pain management center [facility]).  Pt stated that for the last eight months, she has used between 20-24 mg of Dialaud per day, and when she stops, she experiences withdrawal symptoms (including anxiety, sweating, insomnia).  Pt also endorsed ongoing despondency, anxiety, and insomnia.  Pt endorsed episodes of passive suicidal ideation, but no plan, intent, or past attempts.  During assessment, Pt presented as alert and oriented.  She had good eye contact and was cooperative.  Pt was dressed in street clothes, and she appeared appropriately groomed.  Pt's mood and affect were anxious.  Pt was tearful.  Pt's speech was normal in rate, rhythm, and volume.  Thought processes were within normal range, and thought content was logical and goal-oriented.  There was no evidence of delusion.  Pt denied homicidal ideation, hallucination, and self-injurious behavior.  Pt's memory and concentration were intact.  Insight, judgment, and impulse control were fair.  Consulted with L. Maisie Fus, NP, who also spoke with Pt.  As [facility] does not offer strict detox services, author provided Pt with a list of detox services in the area.  She was psych-cleared.  Diagnosis: Opioid Use Disorder; Depression  Past Medical History:  Past Medical History:  Diagnosis Date  . Chronic headaches   . Chronic insomnia 05/20/2018   . Intractable chronic migraine without aura 10/26/2014  . Raynauds phenomenon   . Thyroid disease     Past Surgical History:  Procedure Laterality Date  . WISDOM TOOTH EXTRACTION      Family History:  Family History  Problem Relation Age of Onset  . Hypertension Father   . Leukemia Neg Hx        Hx. of leukemia on father's side of family  . Lung cancer Neg Hx        Hx. of lung cancer on father's side of family  . Breast cancer Neg Hx        Hx. of breast cancer on father's side of family  . Cancer Neg Hx        Hx. of intestinal cancer on father's side of family    Social History:  reports that she has never smoked. She has never used smokeless tobacco. She reports that she does not drink alcohol and does not use drugs.  Additional Social History:  Alcohol / Drug Use Pain Medications: See MAR Prescriptions: See MAR Over the Counter: See MAR History of alcohol / drug use?: Yes Substance #1 Name of Substance 1: Dilaudid 1 - Age of First Use: 17 1 - Amount (size/oz): 20-24 mg per day 1 - Frequency: Daily 1 - Duration: Ongoing 1 - Last Use / Amount: 04/20/2020  CIWA: CIWA-Ar BP: (!) 154/107 Pulse Rate: (!) 115 Anxiety: five COWS:    Allergies:  Allergies  Allergen Reactions  . Metoclopramide Hcl Anaphylaxis    hives  . Amitriptyline Other (See Comments)  Dizzy, anxiety, night terrors  . Decadron [Dexamethasone]     anxiety  . Suboxone [Buprenorphine Hcl-Naloxone Hcl] Other (See Comments)    Chest tightness, trouble breathing, mouth swelling.     Home Medications: (Not in a hospital admission)   OB/GYN Status:  Patient's last menstrual period was 03/30/2020.  General Assessment Data Location of Assessment: [facility] Assessment Services TTS Assessment: In system Is this a Tele or Face-to-Face Assessment?: Face-to-Face Is this an Initial Assessment or a Re-assessment for this encounter?: Initial Assessment Patient Accompanied by:: Adult Permission Given  to speak with another: Yes Name, Relationship and Phone Number: Tracey Ellison [facility], mother) Language Other than English: No Living Arrangements: Other (Comment) (Lives alone) What gender do you identify as?: Female Marital status: Single Pregnancy Status: No Living Arrangements: Alone Can pt return to current living arrangement?: Yes Admission Status: Voluntary Is patient capable of signing voluntary admission?: Yes Referral Source: Self/Family/Friend Insurance type:  Herbalist)  Medical Screening Exam [facility] Walk-in ONLY) Medical Exam completed: Yes  Crisis Care Plan Living Arrangements: Alone Name of Psychiatrist:  (None; sees a neurologist and pain medication specialist) Name of Therapist:  (None)  Education Status Is patient currently in school?: No Is the patient employed, unemployed or receiving disability?: Unemployed  Risk to self with the past 6 months Suicidal Ideation: No-Not Currently/Within Last 6 Months Has patient been a risk to self within the past 6 months prior to admission? : No Suicidal Intent: No Has patient had any suicidal intent within the past 6 months prior to admission? : No Is patient at risk for suicide?: No Suicidal Plan?: No Has patient had any suicidal plan within the past 6 months prior to admission? : No Access to Means: No What has been your use of drugs/alcohol within the last 12 months?:  (Denied illicit use -- see notes) Previous Attempts/Gestures: No Triggers for Past Attempts: None known Intentional Self Injurious Behavior: None Family Suicide History: No Recent stressful life event(s): Recent negative physical changes Persecutory voices/beliefs?: No Depression: Yes Depression Symptoms: Despondent, Tearfulness Substance abuse history and/or treatment for substance abuse?: Yes Suicide prevention information given to non-admitted patients: Not applicable  Risk to Others within the past 6 months Homicidal Ideation: No Does  patient have any lifetime risk of violence toward others beyond the six months prior to admission? : No Thoughts of Harm to Others: No Current Homicidal Intent: No Current Homicidal Plan: No Access to Homicidal Means: No History of harm to others?: No Assessment of Violence: None Noted Does patient have access to weapons?: No Criminal Charges Pending?: No Is patient on probation?: No  Psychosis Hallucinations: None noted Delusions: None noted  Mental Status Report Appearance/Hygiene: Unremarkable Eye Contact: Good Motor Activity: Freedom of movement Speech: Logical/coherent Level of Consciousness: Alert Mood: Anxious Affect: Anxious Anxiety Level: Moderate Thought Processes: Coherent, Relevant Judgement: Partial Orientation: Person, Place, Time, Situation Obsessive Compulsive Thoughts/Behaviors: None  Cognitive Functioning Concentration: Normal Memory: Recent Intact, Remote Intact Is patient IDD: No Insight: Good Impulse Control: Fair Appetite: Good Have you had any weight changes? : No Change Sleep: Decreased Total Hours of Sleep:  (6) Vegetative Symptoms: None  ADLScreening [facility] Assessment Services) Patient's cognitive ability adequate to safely complete daily activities?: Yes Patient able to express need for assistance with ADLs?: Yes Independently performs ADLs?: Yes (appropriate for developmental age)  Prior Inpatient Therapy Prior Inpatient Therapy: No  Prior Outpatient Therapy Prior Outpatient Therapy: Yes Prior Therapy Dates:  (Ongoing) Prior Therapy Facilty/Provider(s):  (Dr. Leonette Most  [place]) Reason for Treatment:  (Depression, migraines) Does patient have an ACCT team?: No Does patient have Intensive In-House Services?  : No Does patient have [facility] services? : No Does patient have P4CC services?: No  ADL Screening (condition at time of admission) Patient's cognitive ability adequate to safely complete daily activities?: Yes Is the patient deaf  or have difficulty hearing?: No Does the patient have difficulty seeing, even when wearing glasses/contacts?: No Does the patient have difficulty concentrating, remembering, or making decisions?: No Patient able to express need for assistance with ADLs?: Yes Does the patient have difficulty dressing or bathing?: No Independently performs ADLs?: Yes (appropriate for developmental age) Does the patient have difficulty walking or climbing stairs?: No Weakness of Legs: None Weakness of Arms/Hands: None  Home Assistive Devices/Equipment Home Assistive Devices/Equipment: None  Therapy Consults (therapy consults require a physician order) PT Evaluation Needed: No OT Evalulation Needed: No SLP Evaluation Needed: No Abuse/Neglect Assessment (Assessment to be complete while patient is alone) Abuse/Neglect Assessment Can Be Completed: Yes Physical Abuse: Denies Verbal Abuse: Denies Sexual Abuse: Denies Exploitation of patient/patient's resources: Denies Self-Neglect: Denies Values / Beliefs Cultural Requests During Hospitalization: None Spiritual Requests During Hospitalization: None Consults Spiritual Care Consult Needed: No Transition of Care Team Consult Needed: No Advance Directives (For Healthcare) Does Patient Have a Medical Advance Directive?: No          Disposition:  Disposition Initial Assessment Completed for this Encounter: Yes Disposition of Patient: Discharge Patient referred to: Other (Comment)  On Site Evaluation by:   Reviewed with Physician:    Dorris Fetch Marzell Isakson 04/20/2020 2:29 PM

## 2020-04-20 NOTE — H&P (Signed)
Behavioral Health Medical Screening Exam  Tracey Ellison is an 29 y.o. female.who presented to [facility], voluntarily, accompanied by her mother who remained present during the assessment. Patient is here requesting detox. She tearfully reported that she has being using Dilaudid for the past several years however, recently, she has realized that she has been using it more often and that she has realized that it has turned into an addiction. She stated that she begin using the medication for chronic migraines prescribed by her neurologist. Added that she then was referred to [facility] for pain management and stated that both her neurologist and pain management doctor recommended that she detox, at home form the Dilaudid and the dose was adjusted. Stated that she was started on Suboxone two weeks ago however, yeah had an allergic reaction so the medication was discontinued. Stated  She felt as though she could not detox from the Dilaudid alone so she came seeking help to assist her along the process. Reported that she is normally prescribed 120 tablets (4 mg) per month  and that she takes at least 5-6 tablets per day totaling 20-24 mg. Reported last use as today around 11:45 am. She denied current withdrawal stat however, did endorse past symptoms of withdrawal described as; sweating, chills, and anxiety. Reported poor sleep but no concerns with appetite. She denied other substance abuse or use. Denied current suicidal thoughts, passive or active, homicidal thoughts or psychosis. She denied prior suicide attempts. Reported most recent trauma as her three dogs passing away one year ago. Reported a diagnosis of depression although noted  heightened depression and anxiety associated  with her Dilaudid abuse. Reported she is currently taking Prestiq for depression prescribed by her neurologist along with Seroquel for sleep and Ativan for anxiety. S  Total Time spent with patient: 20 minutes  Psychiatric Specialty  Exam: Physical Exam Psychiatric:        Behavior: Behavior normal.        Thought Content: Thought content normal.        Judgment: Judgment normal.     Comments: Depression  anxiety     Review of Systems  Psychiatric/Behavioral: Positive for sleep disturbance. Negative for agitation, behavioral problems, confusion, decreased concentration, dysphoric mood, hallucinations, self-injury and suicidal ideas. The patient is nervous/anxious. The patient is not hyperactive.        Depression  Anxiety  Substance abuse    Last menstrual period 03/30/2020.There is no height or weight on file to calculate BMI. General Appearance: Casual Eye Contact:  Good Speech:  Clear and Coherent and Normal Rate Volume:  Normal Mood:  Anxious and Depressed Affect:  Tearful and anxious Thought Process:  Coherent, Linear and Descriptions of Associations: Intact Orientation:  Full (Time, Place, and Person) Thought Content:  Logical Suicidal Thoughts:  No Homicidal Thoughts:  No Memory:  Immediate;   Fair Recent;   Fair Remote;   Fair Judgement:  Fair Insight:  Fair Psychomotor Activity:  Normal Concentration: Concentration: Fair and Attention Span: Fair Recall:  [employer] of Knowledge:Fair Language: Good Akathisia:  Negative Handed:  Right AIMS (if indicated):    Assets:  Communication Skills Desire for Improvement Resilience Social Support Sleep:     Musculoskeletal: Strength & Muscle Tone: within normal limits Gait & Station: normal Patient leans: N/A  Last menstrual period 03/30/2020.  Recommendations: Based on my evaluation the patient does not appear to have an emergency medical condition.   Patient was requesting detox from Dilaudid which was recommended by  her outpatient providers. She denied current withdrawal symptoms. There was no evidence of imminent risk to self or others at present and patient does not meet criteria for psychiatric inpatient admission. She was advised that  [facility] does not accept patients for strictly detox although information was provided for [facility] who does provide treatment for medical detox. TTS counselor called [facility] for verification of additional information. This information was provided to patient and her mother and patient was highly encouraged to follow-up.  Patient cleared to the leave the hospitable with resource for detox provided.   Denzil Magnuson, NP 04/20/2020, 1:51 PM

## 2020-04-24 ENCOUNTER — Other Ambulatory Visit: Payer: Self-pay

## 2020-04-26 MED ORDER — ONDANSETRON HCL 4 MG PO TABS: 4.0000 mg | ORAL_TABLET | Freq: Three times a day (TID) | ORAL | 0 refills | Status: DC | PRN

## 2020-04-27 ENCOUNTER — Other Ambulatory Visit: Payer: Self-pay | Admitting: Neurology

## 2020-04-27 MED ORDER — METHOCARBAMOL 750 MG PO TABS: 750.0000 mg | ORAL_TABLET | Freq: Three times a day (TID) | ORAL | 1 refills | Status: DC

## 2020-05-02 ENCOUNTER — Other Ambulatory Visit: Payer: Self-pay | Admitting: Neurology

## 2020-05-02 ENCOUNTER — Telehealth: Payer: Self-pay | Admitting: Neurology

## 2020-05-02 MED ORDER — CLONIDINE HCL 0.1 MG PO TABS: 0.1000 mg | ORAL_TABLET | Freq: Two times a day (BID) | ORAL | 1 refills | Status: DC

## 2020-05-02 NOTE — Telephone Encounter (Signed)
Filled out [employer] PA form for Botox. Will give to MD to sign. Patient will use [facility] if PA is approved.

## 2020-05-03 NOTE — Telephone Encounter (Signed)
Faxed signed PA form to [employer]. 

## 2020-05-09 MED ORDER — KETOROLAC TROMETHAMINE 10 MG PO TABS
10.0000 mg | ORAL_TABLET | Freq: Four times a day (QID) | ORAL | 3 refills | Status: DC | PRN
Start: 2020-05-09 — End: 2021-02-09

## 2020-05-09 MED ORDER — CLONIDINE HCL 0.1 MG PO TABS: 0.1000 mg | ORAL_TABLET | Freq: Two times a day (BID) | ORAL | 0 refills | Status: DC

## 2020-05-09 NOTE — Addendum Note (Signed)
Addended by: Ann Maki on: 05/09/2020 04:59 PM   Modules accepted: Orders

## 2020-05-15 ENCOUNTER — Other Ambulatory Visit: Payer: Self-pay | Admitting: Neurology

## 2020-05-16 NOTE — Telephone Encounter (Signed)
Received approval from [facility] via fax. PA #[id number] (05/03/20- 10/31/20). Specialty Pharmacy info packet/Botox Savings Program info mailed.

## 2020-05-17 ENCOUNTER — Other Ambulatory Visit: Payer: Self-pay

## 2020-05-19 ENCOUNTER — Other Ambulatory Visit: Payer: Self-pay | Admitting: Neurology

## 2020-05-19 MED ORDER — CLONIDINE HCL 0.1 MG PO TABS
0.1000 mg | ORAL_TABLET | Freq: Two times a day (BID) | ORAL | 0 refills | Status: DC
Start: 2020-05-19 — End: 2020-06-15

## 2020-05-25 ENCOUNTER — Other Ambulatory Visit: Payer: Self-pay | Admitting: Neurology

## 2020-05-31 NOTE — Telephone Encounter (Signed)
I called [facility] and spoke with Annabelle Harman to see if anything else needed to be done regarding patient's Botox. She states all the patient needs to do is give consent for shipment. Dana set up a tentative delivery date of 10/19.

## 2020-05-31 NOTE — Telephone Encounter (Signed)
I called [facility] this morning and spoke with Dynetta to see what is needed for [facility] to set up Botox delivery. Dynetta states a prescription is needed. She transferred me over to the pharmacy and I spoke with Lorin Picket. I gave Lorin Picket the Botox prescription. He states he will add the information into the system.

## 2020-06-01 ENCOUNTER — Encounter: Payer: Self-pay | Admitting: Emergency Medicine

## 2020-06-07 ENCOUNTER — Other Ambulatory Visit: Payer: Self-pay | Admitting: Neurology

## 2020-06-07 NOTE — Telephone Encounter (Signed)
(  1) 200U vial of Botox delivered from [facility].

## 2020-06-15 ENCOUNTER — Encounter: Payer: Self-pay | Admitting: Neurology

## 2020-06-15 ENCOUNTER — Other Ambulatory Visit: Payer: Self-pay

## 2020-06-15 ENCOUNTER — Ambulatory Visit: Payer: BC Managed Care – PPO | Admitting: Neurology

## 2020-06-15 VITALS — BP 162/101 | HR 91 | Ht 67.0 in | Wt 176.0 lb

## 2020-06-15 DIAGNOSIS — G43711 Chronic migraine without aura, intractable, with status migrainosus: Secondary | ICD-10-CM | POA: Diagnosis not present

## 2020-06-15 MED ORDER — METHOCARBAMOL 750 MG PO TABS
750.0000 mg | ORAL_TABLET | Freq: Three times a day (TID) | ORAL | 1 refills | Status: DC
Start: 2020-06-15 — End: 2020-08-08

## 2020-06-15 MED ORDER — CLONIDINE HCL 0.1 MG PO TABS: 0.1000 mg | ORAL_TABLET | Freq: Every day | ORAL | 3 refills | Status: AC

## 2020-06-15 NOTE — Procedures (Signed)
     BOTOX PROCEDURE NOTE FOR MIGRAINE HEADACHE   HISTORY: Tracey Ellison is a 29 year old patient with intractable headaches.  She has had Botox injections in the past with good benefit, she returns for another treatment.  Currently, she is having very frequent headaches, almost daily.   Description of procedure:  The patient was placed in a sitting position. The standard protocol was used for Botox as follows, with 5 units of Botox injected at each site:   -Procerus muscle, midline injection  -Corrugator muscle, bilateral injection  -Frontalis muscle, bilateral injection, with 2 sites each side, medial injection was performed in the upper one third of the frontalis muscle, in the region vertical from the medial inferior edge of the superior orbital rim. The lateral injection was again in the upper one third of the forehead vertically above the lateral limbus of the cornea, 1.5 cm lateral to the medial injection site.  -Temporalis muscle injection, 4 sites, bilaterally. The first injection was 3 cm above the tragus of the ear, second injection site was 1.5 cm to 3 cm up from the first injection site in line with the tragus of the ear. The third injection site was 1.5-3 cm forward between the first 2 injection sites. The fourth injection site was 1.5 cm posterior to the second injection site.  -Occipitalis muscle injection, 3 sites, bilaterally. The first injection was done one half way between the occipital protuberance and the tip of the mastoid process behind the ear. The second injection site was done lateral and superior to the first, 1 fingerbreadth from the first injection. The third injection site was 1 fingerbreadth superiorly and medially from the first injection site.  -Cervical paraspinal muscle injection, 2 sites, bilateral, the first injection site was 1 cm from the midline of the cervical spine, 3 cm inferior to the lower border of the occipital protuberance. The second  injection site was 1.5 cm superiorly and laterally to the first injection site.  -Trapezius muscle injection was performed at 3 sites, bilaterally. The first injection site was in the upper trapezius muscle halfway between the inflection point of the neck, and the acromion. The second injection site was one half way between the acromion and the first injection site. The third injection was done between the first injection site and the inflection point of the neck.   A 200 unit bottle of Botox was used, 155 units were injected, the rest of the Botox was wasted. The patient tolerated the procedure well, there were no complications of the above procedure.  Botox NDC 5427-0623-76 Lot number [id number] Expiration date April 2024

## 2020-06-15 NOTE — Progress Notes (Signed)
The patient comes in today for a Botox treatment.  She indicates that she is still having significant amounts of headache.  She is not working, she is on COBRA for her medical insurance.  She finds that the clonidine use for opiate withdrawal has been helpful to allow her to sleep, she takes a half a tablet at night and is able to rest better, this helps more than the Seroquel.  She does feel better on the Robaxin as well.  She has been off of Dilaudid for 8 weeks now.  She still has some sweats at times.  In the past, she has gotten some benefit from Botox.

## 2020-06-19 ENCOUNTER — Other Ambulatory Visit: Payer: Self-pay | Admitting: Neurology

## 2020-06-19 MED ORDER — DESVENLAFAXINE SUCCINATE ER 50 MG PO TB24
50.0000 mg | ORAL_TABLET | Freq: Every day | ORAL | 1 refills | Status: DC
Start: 2020-06-19 — End: 2020-12-13

## 2020-06-19 NOTE — Telephone Encounter (Signed)
Please discuss future refills with primary care Physician, If you don't have one, please get one. You prescription was last refilled on 03-24-2020 for 90 days.  We added 90 days today.

## 2020-07-18 ENCOUNTER — Other Ambulatory Visit: Payer: Self-pay | Admitting: Neurology

## 2020-07-18 MED ORDER — LORAZEPAM 0.5 MG PO TABS: 0.5000 mg | ORAL_TABLET | Freq: Every evening | ORAL | 0 refills | Status: DC | PRN

## 2020-07-18 NOTE — Telephone Encounter (Signed)
Please call patient and advise her that I am not sure if Dr. Anne Hahn meant for her to take Ativan on a daily basis. I will renew her prescription for 30 days with the current dose of 0.5 mg at night as needed.

## 2020-07-19 NOTE — Telephone Encounter (Signed)
Called patient and advised her of Dr. Teofilo Pod recommendation and refill of her prescription x1.  She expressed appreication however, wanted Dr. Anne Hahn to be aware she wasn't getting an increase of medication and still wanted more.

## 2020-08-02 ENCOUNTER — Other Ambulatory Visit: Payer: Self-pay | Admitting: Neurology

## 2020-08-02 MED ORDER — BUSPIRONE HCL 5 MG PO TABS: 5.0000 mg | ORAL_TABLET | Freq: Three times a day (TID) | ORAL | 1 refills | Status: DC

## 2020-08-08 ENCOUNTER — Other Ambulatory Visit: Payer: Self-pay | Admitting: Neurology

## 2020-08-08 MED ORDER — METHOCARBAMOL 750 MG PO TABS
750.0000 mg | ORAL_TABLET | Freq: Three times a day (TID) | ORAL | 1 refills | Status: DC
Start: 2020-08-08 — End: 2022-01-22

## 2020-08-15 ENCOUNTER — Other Ambulatory Visit: Payer: Self-pay | Admitting: Neurology

## 2020-08-16 MED ORDER — LORAZEPAM 0.5 MG PO TABS: 0.5000 mg | ORAL_TABLET | Freq: Every evening | ORAL | 0 refills | Status: DC | PRN

## 2020-08-25 ENCOUNTER — Ambulatory Visit: Payer: BC Managed Care – PPO | Admitting: Neurology

## 2020-08-26 ENCOUNTER — Other Ambulatory Visit: Payer: Self-pay | Admitting: Neurology

## 2020-08-26 DIAGNOSIS — G4489 Other headache syndrome: Secondary | ICD-10-CM

## 2020-09-07 ENCOUNTER — Other Ambulatory Visit: Payer: Self-pay | Admitting: Neurology

## 2020-09-07 MED ORDER — TRAMADOL HCL 50 MG PO TABS: 50.0000 mg | ORAL_TABLET | Freq: Four times a day (QID) | ORAL | 1 refills | Status: DC | PRN

## 2020-09-19 ENCOUNTER — Ambulatory Visit: Payer: BC Managed Care – PPO | Admitting: Neurology

## 2020-09-19 NOTE — Telephone Encounter (Signed)
Received [facility] insurance information. Filled out [employer] PA form and faxed with clinical notes.

## 2020-09-22 NOTE — Telephone Encounter (Signed)
I called [employer] and spoke with AJ to check status of PA. AJ states PA is still pending. Pending PA #[id number]. [employer] [facility] now uses [facility] so I have filled out SP enrollment/prescription form. Will give to MD to sign once [facility] approval is received.

## 2020-09-22 NOTE — Telephone Encounter (Signed)
Received denial from [facility] stating the 2 sets of chart notes provided for review do not contain sufficient information about patient's headaches to permit a conclusion that patient has chronic migraine headaches. Option to appeal was given and appeal form was provided. Filled out appeal form and will get patient to sign.

## 2020-09-27 NOTE — Telephone Encounter (Signed)
I called the patient and LVM asking her to call me back to discuss her signing the appeal forms for [facility].

## 2020-10-04 ENCOUNTER — Ambulatory Visit: Payer: BC Managed Care – PPO | Admitting: Neurology

## 2020-10-04 NOTE — Telephone Encounter (Signed)
I filled out [facility] prescription enrollment form and faxed to them so they can start on the Botox order.

## 2020-10-04 NOTE — Telephone Encounter (Signed)
Received patient's signature for appeal form. I faxed appeal form with additional notes to [facility].

## 2020-10-10 NOTE — Telephone Encounter (Signed)
Received fax from [facility] confirming receipt of appeal. [employer] states they will notify us of their decision within 30 days.

## 2020-10-11 DIAGNOSIS — Z0271 Encounter for disability determination: Secondary | ICD-10-CM

## 2020-10-17 ENCOUNTER — Other Ambulatory Visit: Payer: Self-pay | Admitting: Neurology

## 2020-10-17 MED ORDER — LORAZEPAM 0.5 MG PO TABS: 1.0000 mg | ORAL_TABLET | Freq: Every evening | ORAL | 1 refills | Status: DC | PRN

## 2020-10-17 NOTE — Telephone Encounter (Signed)
Have not received appeal approval from [facility], but we did receive pharmacy approval from [employer]. PA reference #[id number] (10/13/20- 10/13/21).

## 2020-10-25 ENCOUNTER — Other Ambulatory Visit: Payer: Self-pay | Admitting: Neurology

## 2020-10-26 NOTE — Telephone Encounter (Signed)
Received a call from [facility] with [facility] to schedule Botox delivery. Botox TBD 3/16.

## 2020-11-02 ENCOUNTER — Other Ambulatory Visit: Payer: Self-pay | Admitting: Neurology

## 2020-11-02 NOTE — Telephone Encounter (Signed)
Received (1) 200 unit vial of Botox today from [facility]. Still no update on the appeal with [facility]. I called the patient just to make her aware, advised that the appeal may take another month or so to come back. She was fine with this, but wants to know if Dr. Anne Hahn can increase tramadol slightly until she is able to have her Botox appointment.

## 2020-11-07 NOTE — Telephone Encounter (Addendum)
Received additional denial from [facility] after appeal was requested. We have the option to move forward with a second level appeal. I have written a letter of medical necessity and give to MD to sign when he gets back in office next week. I have printed out all office notes since 2014 and plan to fax these for the review.

## 2020-11-14 NOTE — Telephone Encounter (Signed)
Gave appeal letter to MD to sign today. Received signed appeal letter and faxed to [facility] with 93 pages of medical records dating back to 2014.

## 2020-12-03 ENCOUNTER — Other Ambulatory Visit: Payer: Self-pay | Admitting: Neurology

## 2020-12-05 ENCOUNTER — Other Ambulatory Visit: Payer: Self-pay | Admitting: Emergency Medicine

## 2020-12-06 ENCOUNTER — Other Ambulatory Visit: Payer: Self-pay | Admitting: Emergency Medicine

## 2020-12-06 MED ORDER — TRAMADOL HCL 50 MG PO TABS: 50.0000 mg | ORAL_TABLET | Freq: Four times a day (QID) | ORAL | 0 refills | Status: DC | PRN

## 2020-12-06 NOTE — Telephone Encounter (Signed)
Meds ordered this encounter  Medications  . traMADol (ULTRAM) 50 MG tablet    Sig: Take 1 tablet (50 mg total) by mouth every 6 (six) hours as needed.    Dispense:  30 tablet    Refill:  0    Suanne Marker, MD 12/06/2020, 11:08 AM Certified in Neurology, Neurophysiology and Neuroimaging  [facility] [address] [place], [place] [address] [phone]

## 2020-12-06 NOTE — Addendum Note (Signed)
Addended by: Joycelyn Schmid R on: 12/06/2020 11:09 AM   Modules accepted: Orders

## 2020-12-12 ENCOUNTER — Other Ambulatory Visit: Payer: Self-pay | Admitting: Neurology

## 2020-12-12 MED ORDER — TRAMADOL HCL 50 MG PO TABS: 50.0000 mg | ORAL_TABLET | Freq: Four times a day (QID) | ORAL | 0 refills | Status: DC | PRN

## 2020-12-12 NOTE — Progress Notes (Signed)
The patient normally gets 60 tramadol a month, she got 30 tablets on 19 April, I will give her another 30 tablets.

## 2020-12-13 ENCOUNTER — Telehealth: Payer: Self-pay | Admitting: Neurology

## 2020-12-13 ENCOUNTER — Other Ambulatory Visit: Payer: Self-pay | Admitting: Neurology

## 2020-12-13 NOTE — Telephone Encounter (Signed)
FYI - Thanks Annabelle Harman    General 12/13/2020 8:19 AM [facility], Christ Kick - -  Note   Good Morning Rhyann here are some names for you . You can research and let me know .  [facility]  [phone]  Dr. Raelyn Ensign  Dr. Oneal Grout Gulur  Jeani Sow - Np   [facility]  - [phone]  Reuel Boom Bintrim   [facility] [phone] Georga Kaufmann .

## 2020-12-19 NOTE — Telephone Encounter (Signed)
Have not received an update on the 2nd level appeal for Botox. I called the number listed at the bottom of the letter twice today ([phone]) but was not able to reach anyone.

## 2020-12-20 NOTE — Telephone Encounter (Signed)
I called [phone] (number listed on appeal) and was routed to voicemail. I LVM requesting a call back to discuss.

## 2020-12-21 ENCOUNTER — Other Ambulatory Visit: Payer: Self-pay | Admitting: Neurology

## 2020-12-21 MED ORDER — TRAMADOL HCL 50 MG PO TABS: 50.0000 mg | ORAL_TABLET | Freq: Four times a day (QID) | ORAL | 0 refills | Status: DC | PRN

## 2021-01-03 ENCOUNTER — Other Ambulatory Visit: Payer: Self-pay | Admitting: Emergency Medicine

## 2021-01-03 NOTE — Progress Notes (Signed)
Error

## 2021-01-04 MED ORDER — TRAMADOL HCL 50 MG PO TABS: 50.0000 mg | ORAL_TABLET | Freq: Four times a day (QID) | ORAL | 0 refills | Status: DC | PRN

## 2021-01-04 NOTE — Telephone Encounter (Signed)
I received a fax from [facility] approving Botox & overturning the denial. Authorization case number: [id number]. However, this was approved for 10/04/20- 01/02/21. I had no prior knowledge that this was approved before today, so I will call [facility] and see what is needed next.

## 2021-01-17 MED ORDER — TRAMADOL HCL 50 MG PO TABS: 50.0000 mg | ORAL_TABLET | Freq: Four times a day (QID) | ORAL | 0 refills | Status: DC | PRN

## 2021-01-17 NOTE — Telephone Encounter (Signed)
Regarding patient's Botox denial- see my note from 5/18. I think the only thing to be done as far as Botox is to submit another authorization seeing as we weren't made aware of the approval until after it had expired. I can do that today, just let me know.

## 2021-01-17 NOTE — Telephone Encounter (Signed)
Submitted new PA to [facility].

## 2021-01-17 NOTE — Addendum Note (Signed)
Addended by: Alexia Freestone R on: 01/17/2021 10:28 AM   Modules accepted: Orders

## 2021-01-19 NOTE — Telephone Encounter (Signed)
Patient has (1) 200 unit vial of Botox here already from [facility] [phone]). I called them today and spoke with Mya to get a second shipment for patient's next injection 3 months out, so we won't have to worry about it then. That shipment will be here 6/7. Received approval from [facility] this morning via fax for injections. Good for 1 injection between 01/17/21- 04/19/21. PA #[id number].  Patient's PA with [employer] for the Botox is good through 10/13/21. PA #[id number] [phone]).

## 2021-01-23 NOTE — Telephone Encounter (Signed)
I called Dr. Mayford Knife' office @ [phone] and LVM requesting a call back to discuss patient's appointment (if they need any notes faxed over).

## 2021-01-24 NOTE — Telephone Encounter (Signed)
Received shipment of (1) 200 unit vial of Botox from [facility]. This will be used for patient's Botox appointment in September.

## 2021-01-25 NOTE — Telephone Encounter (Signed)
Dr. Mayford Knife' office returned my call. Advised me to fax records to [phone].

## 2021-01-30 ENCOUNTER — Telehealth: Payer: Self-pay | Admitting: Emergency Medicine

## 2021-01-30 MED ORDER — TRAMADOL HCL 50 MG PO TABS: 50.0000 mg | ORAL_TABLET | Freq: Four times a day (QID) | ORAL | 0 refills | Status: DC | PRN

## 2021-01-30 NOTE — Addendum Note (Signed)
Addended by: York Spaniel on: 01/30/2021 03:27 PM   Modules accepted: Orders

## 2021-01-30 NOTE — Telephone Encounter (Signed)
The Ultram prescription was sent in 

## 2021-02-09 ENCOUNTER — Ambulatory Visit: Payer: 59 | Admitting: Neurology

## 2021-02-09 ENCOUNTER — Encounter: Payer: Self-pay | Admitting: Neurology

## 2021-02-09 VITALS — BP 142/92 | HR 89 | Ht 67.0 in | Wt 187.6 lb

## 2021-02-09 DIAGNOSIS — G43711 Chronic migraine without aura, intractable, with status migrainosus: Secondary | ICD-10-CM

## 2021-02-09 NOTE — Procedures (Signed)
     BOTOX PROCEDURE NOTE FOR MIGRAINE HEADACHE   HISTORY: Tracey Ellison is a 30 year old patient with a history of intractable migraine headache.  She continues to have most days with headache.  She comes in for another Botox therapy.   Description of procedure:  The patient was placed in a sitting position. The standard protocol was used for Botox as follows, with 5 units of Botox injected at each site:   -Procerus muscle, midline injection  -Corrugator muscle, bilateral injection  -Frontalis muscle, bilateral injection, with 2 sites each side, medial injection was performed in the upper one third of the frontalis muscle, in the region vertical from the medial inferior edge of the superior orbital rim. The lateral injection was again in the upper one third of the forehead vertically above the lateral limbus of the cornea, 1.5 cm lateral to the medial injection site.  -Temporalis muscle injection, 4 sites, bilaterally. The first injection was 3 cm above the tragus of the ear, second injection site was 1.5 cm to 3 cm up from the first injection site in line with the tragus of the ear. The third injection site was 1.5-3 cm forward between the first 2 injection sites. The fourth injection site was 1.5 cm posterior to the second injection site.  -Occipitalis muscle injection, 3 sites, bilaterally. The first injection was done one half way between the occipital protuberance and the tip of the mastoid process behind the ear. The second injection site was done lateral and superior to the first, 1 fingerbreadth from the first injection. The third injection site was 1 fingerbreadth superiorly and medially from the first injection site.  -Cervical paraspinal muscle injection, 2 sites, bilateral, the first injection site was 1 cm from the midline of the cervical spine, 3 cm inferior to the lower border of the occipital protuberance. The second injection site was 1.5 cm superiorly and laterally to the  first injection site.  -Trapezius muscle injection was performed at 3 sites, bilaterally. The first injection site was in the upper trapezius muscle halfway between the inflection point of the neck, and the acromion. The second injection site was one half way between the acromion and the first injection site. The third injection was done between the first injection site and the inflection point of the neck.   A 200 unit bottle of Botox was used, 155 units were injected, the rest of the Botox was wasted. The patient tolerated the procedure well, there were no complications of the above procedure.  Botox NDC 4765-4650-35 Lot number [id number] Expiration date October 2024

## 2021-02-09 NOTE — Progress Notes (Signed)
Please refer to Botox procedure note. 

## 2021-02-13 ENCOUNTER — Other Ambulatory Visit: Payer: Self-pay

## 2021-02-13 ENCOUNTER — Other Ambulatory Visit: Payer: Self-pay | Admitting: Neurology

## 2021-02-14 MED ORDER — TRAMADOL HCL 50 MG PO TABS: 50.0000 mg | ORAL_TABLET | Freq: Four times a day (QID) | ORAL | 0 refills | Status: DC | PRN

## 2021-02-26 ENCOUNTER — Other Ambulatory Visit: Payer: Self-pay | Admitting: Neurology

## 2021-02-26 MED ORDER — TRAMADOL HCL 50 MG PO TABS: 50.0000 mg | ORAL_TABLET | Freq: Four times a day (QID) | ORAL | 0 refills | Status: DC | PRN

## 2021-03-08 ENCOUNTER — Other Ambulatory Visit: Payer: Self-pay | Admitting: Neurology

## 2021-03-13 DIAGNOSIS — Z0289 Encounter for other administrative examinations: Secondary | ICD-10-CM

## 2021-03-17 IMAGING — DX DG CHEST 1V PORT
1 series · 1 of 1 positions shown · non-contrast
Comparison: 01/29/2014

CLINICAL DATA: Chest tightness.

EXAM:
PORTABLE CHEST 1 VIEW

[chest ap]
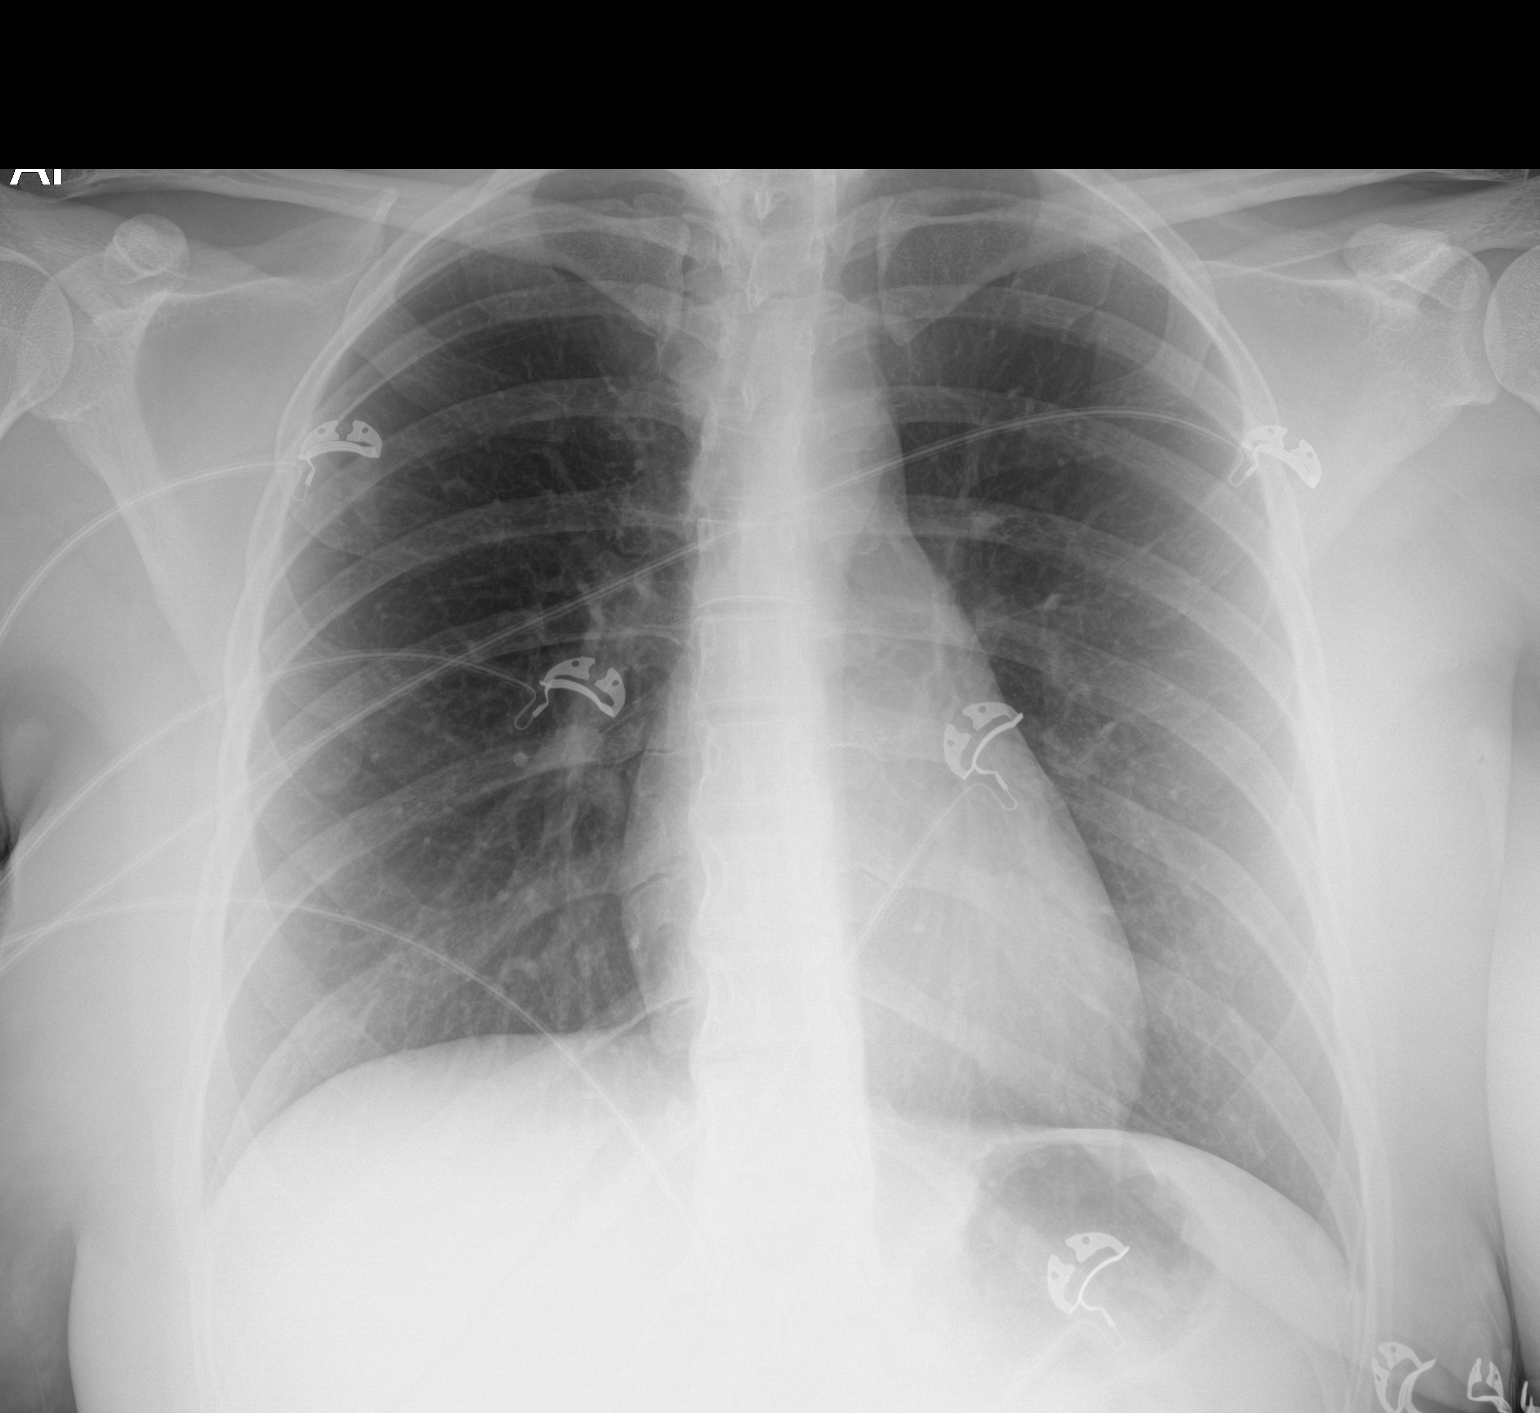

[1 of 1 positions shown; findings below may reference images not displayed]

FINDINGS: The heart size and mediastinal contours are within normal limits.
Both lungs are clear. No pleural effusion. No discernible
pneumothorax. The visualized skeletal structures are unremarkable.
IMPRESSION: No acute cardiopulmonary disease.

## 2021-03-22 ENCOUNTER — Other Ambulatory Visit: Payer: Self-pay | Admitting: Neurology

## 2021-03-22 MED ORDER — TRAMADOL HCL 50 MG PO TABS: 50.0000 mg | ORAL_TABLET | Freq: Four times a day (QID) | ORAL | 0 refills | Status: DC | PRN

## 2021-04-14 ENCOUNTER — Other Ambulatory Visit: Payer: Self-pay | Admitting: Neurology

## 2021-04-19 ENCOUNTER — Telehealth: Payer: Self-pay | Admitting: Neurology

## 2021-04-19 MED ORDER — DESVENLAFAXINE SUCCINATE ER 50 MG PO TB24: 50.0000 mg | ORAL_TABLET | Freq: Every day | ORAL | 1 refills | Status: AC

## 2021-04-19 MED ORDER — TRAMADOL HCL 50 MG PO TABS: 50.0000 mg | ORAL_TABLET | Freq: Four times a day (QID) | ORAL | 0 refills | Status: DC | PRN

## 2021-04-19 NOTE — Telephone Encounter (Signed)
The prescription for Pristiq and Ultram were refilled.

## 2021-04-19 NOTE — Telephone Encounter (Signed)
Tammy Sours from [facility] called has some questions about traMADol (ULTRAM) 50 MG tablet. Requesting a call back.

## 2021-04-19 NOTE — Telephone Encounter (Signed)
I called the pharmacist.  I indicated that the prescription was sent in on 3 August indicating that this is 28 days from the last prescription for Ultram today.  The patient however did not pick up the prescription until 10 August.  I indicated is okay to give the patient the prescription for Ultram now, she will not be able to have another written prescription for 28 days from today.

## 2021-04-19 NOTE — Telephone Encounter (Signed)
Tammy Sours, pharmacist with [name] called. He states refill was received for tramadol but pt is not due for rx refill until 04/24/2021 per [place] drug registry ( last refill was 03/27/2021). Tammy Sours states pt has discussed with him at times she is taking more than prescribed of the Tramadol.  Pt wanted to pick this medication up before leaving to go on vacation.   Tammy Sours wanted to know if override for medication for early pick up will be given? Will fwd to MD.

## 2021-05-18 ENCOUNTER — Other Ambulatory Visit: Payer: Self-pay | Admitting: Neurology

## 2021-05-18 ENCOUNTER — Ambulatory Visit: Payer: 59 | Admitting: Neurology

## 2021-05-18 MED ORDER — TRAMADOL HCL 50 MG PO TABS: 50.0000 mg | ORAL_TABLET | Freq: Four times a day (QID) | ORAL | 0 refills | Status: DC | PRN

## 2021-05-29 ENCOUNTER — Other Ambulatory Visit: Payer: Self-pay | Admitting: Neurology

## 2021-05-31 NOTE — Progress Notes (Signed)
Referring:  No referring provider defined for this encounter.  PCP: Ferdinand Cava, MD  Neurology was asked to evaluate Tracey Ellison, a 30 year old female for a chief complaint of headaches.  Our recommendations of care will be communicated by shared medical record.    CC:  headaches  HPI:  Medical co-morbidities: hypothyroidism, depression, prior substance addiction (dilaudid)  The patient presents for evaluation of chronic headaches which have been present for the past 12 years. Headaches are constant with fluctuating intensity. She previously followed with Dr. Anne Hahn who prescribed tramadol as needed for migraines. Tramadol does not resolve the headache but takes the edge off. She typically takes this 1-2 times per day. Also takes seroquel 50 mg TID, and pristiq 50 mg daily. Takes magnesium, vitamin C, and fish oil as well.  She has failed multiple medications and procedures including trigger point injections and Botox (5-6 trials) due to lack of efficacy or side effects.  Headache History: Onset: 12 years ago Triggers: storms, humidity, garlic, screen time Most common time of day for headache to begin: any time Aura: no Location: bilateral occiput Quality/Description: pressure, throbbing Severity: 5-10/10 Associated Symptoms:  Photophobia: no  Phonophobia: no  Nausea: no Worse with activity?: yes Duration of headaches: constant with severe flares up to 4 hours   Headache days per month: 30 Headache free days per month: 0  Current Treatment: Abortive Tramadol Celebrex  Preventative Pristiq  Prior Therapies                                 Botox Amitriptyline Gabapentin lyrica Emgality Ajovy Aimovig Vyepti - nausea, throat tightness cyproheptadine Venlafaxine Lexapro pristiq Wellbutrin topamax Tizanidine Dilaudid Tramadol Facet injections Lasmiditan Imitrex Zomig Maxalt Relpax Amerge Ubrelvy Nurtec Toradol Trigger point  injections Occipital nerve block Tens unit Alpha stim   LABS: CBC    Component Value Date/Time   WBC 7.2 04/14/2020 1233   RBC 4.38 04/14/2020 1233   HGB 12.9 04/14/2020 1233   HCT 38.1 04/14/2020 1233   PLT 328 04/14/2020 1233   MCV 87.0 04/14/2020 1233   MCH 29.5 04/14/2020 1233   MCHC 33.9 04/14/2020 1233   RDW 13.4 04/14/2020 1233   CMP     Component Value Date/Time   NA 137 04/14/2020 1233   K 3.3 (L) 04/14/2020 1233   CL 108 04/14/2020 1233   CO2 20 (L) 04/14/2020 1233   GLUCOSE 109 (H) 04/14/2020 1233   BUN 9 04/14/2020 1233   CREATININE 0.80 04/14/2020 1233   CALCIUM 8.4 (L) 04/14/2020 1233   GFRNONAA >60 04/14/2020 1233   GFRAA >60 04/14/2020 1233     IMAGING:  03/10/19 MRI C-spine: small disc bulges at C3-4 through C6-7, no nerve root compression or spinal stenosis  10/25/2010 CTH: no intracranial abnormality, acute left sphenoid sinusitis  Current Outpatient Medications on File Prior to Visit  Medication Sig Dispense Refill   cloNIDine (CATAPRES) 0.1 MG tablet Take 1 tablet (0.1 mg total) by mouth daily. 30 tablet 3   desvenlafaxine (PRISTIQ) 50 MG 24 hr tablet Take 1 tablet (50 mg total) by mouth daily. 90 tablet 1   [name] 1-20 MG-MCG(24) tablet Take 1 tablet by mouth daily.     levocetirizine (XYZAL) 5 MG tablet levocetirizine 5 mg tablet  TAKE ONE TABLET BY MOUTH DAILY     LORazepam (ATIVAN) 0.5 MG tablet TAKE TWO TABLETS BY MOUTH EVERY EVENING AS NEEDED  FOR ANXIETY 60 tablet 2   methocarbamol (ROBAXIN) 750 MG tablet Take 1 tablet (750 mg total) by mouth 3 (three) times daily. 90 tablet 1   NP THYROID 90 MG tablet Take 90 mg by mouth daily.     QUEtiapine (SEROQUEL) 50 MG tablet TAKE THREE TABLETS BY MOUTH EVERY NIGHT AT BEDTIME 90 tablet 4   traMADol (ULTRAM) 50 MG tablet Take 1 tablet (50 mg total) by mouth every 6 (six) hours as needed. 120 tablet 0   No current facility-administered medications on file prior to visit.      Allergies: Allergies  Allergen Reactions   Metoclopramide Hcl Anaphylaxis    hives   Amitriptyline Other (See Comments)    Dizzy, anxiety, night terrors   Decadron [Dexamethasone]     anxiety   Suboxone [Buprenorphine Hcl-Naloxone Hcl] Other (See Comments)    Chest tightness, trouble breathing, mouth swelling.     Past Medical History: Past Medical History:  Diagnosis Date   Chronic headaches    Chronic insomnia 05/20/2018   Intractable chronic migraine without aura 10/26/2014   Raynauds phenomenon    Thyroid disease     Past Surgical History Past Surgical History:  Procedure Laterality Date   WISDOM TOOTH EXTRACTION      Social History: Social History   Tobacco Use   Smoking status: Never   Smokeless tobacco: Never  Substance Use Topics   Alcohol use: No   Drug use: No    Comment: Dilaudid withdrawal/last dose 04/15/20    ROS: Negative for fevers, chills. Positive for headaches. All other systems reviewed and negative unless stated otherwise in HPI.   Physical Exam:   Vital Signs: BP (!) 139/91   Pulse 97   Ht 5\' 7"  (1.702 m)   Wt 200 lb (90.7 kg)   BMI 31.32 kg/m  GENERAL: well appearing,in no acute distress,alert SKIN:  Color, texture, turgor normal. No rashes or lesions HEAD:  Normocephalic/atraumatic. CV:  RRR RESP: Normal respiratory effort MSK: mild tenderness over occiput  NEUROLOGICAL: Mental Status: Alert, oriented to person, place and time,Follows commands Cranial Nerves: PERRL,visual fields intact to confrontation,extraocular movements intact,facial sensation intact,no facial droop or ptosis,hearing intact to finger rub bilaterally,no dysarthria,palate elevate symmetrically,tongue protrudes midline,shoulder shrug intact and symmetric Motor: muscle strength 5/5 both upper and lower extremities,no drift, normal tone Reflexes: 2+ throughout Sensation: intact to light touch all 4 extremities Coordination: Finger-to- nose-finger intact  bilaterally,Heel-to-shin intact bilaterally Gait: normal-based   IMPRESSION: 30 year old female with a history of hypothyroidism, dilaudid dependence who presents for evaluation of chronic daily headaches. Discussed medication overuse headache and she is agreeable to weaning off of Tramadol. Will plan for slow 2 month taper off of Tramadol. In the meantime will start Qulipta for headache prevention. Counseled patient that preventive medications may be less effective while on chronic opiate therapy. Discussed nonmedication options including supplements and Cefaly device. She currently takes Seroquel and ativan for anxiety and sleep and would be interested in other medication options. Will refer to Psychiatry for evaluation and management of anxiety.  PLAN: -Start Qulipta 60 mg daily -Tramadol wean: Decrease by 25 mg daily per week until off -Referral to Psychiatry for anxiety management   I spent a total of 55 minutes chart reviewing and counseling the patient. Headache education was done. Discussed treatment options including preventive and acute medications, natural supplements, and physical therapy. Discussed medication overuse headache and to limit use of acute treatments to no more than 2 days/week or 10  days/month. Discussed medication side effects, adverse reactions and drug interactions. Written educational materials and patient instructions outlining all of the above were given.  Follow-up: 3 months   Ocie Doyne, MD 06/01/2021   9:00 AM

## 2021-06-01 ENCOUNTER — Ambulatory Visit: Payer: 59 | Admitting: Psychiatry

## 2021-06-01 ENCOUNTER — Encounter: Payer: Self-pay | Admitting: Psychiatry

## 2021-06-01 VITALS — BP 139/91 | HR 97 | Ht 67.0 in | Wt 200.0 lb

## 2021-06-01 DIAGNOSIS — G43719 Chronic migraine without aura, intractable, without status migrainosus: Secondary | ICD-10-CM

## 2021-06-01 DIAGNOSIS — F419 Anxiety disorder, unspecified: Secondary | ICD-10-CM | POA: Diagnosis not present

## 2021-06-01 MED ORDER — QULIPTA 60 MG PO TABS: 60.0000 mg | ORAL_TABLET | Freq: Every day | ORAL | 0 refills | Status: DC

## 2021-06-01 NOTE — Patient Instructions (Addendum)
Start Qulipta 60 mg daily for headaches Taper off of Tramadol. Take 3.5 pills daily for one week, then 3 pills daily for one week, then 2.5 pills daily for one week, then 2 pills daily for one week, then 1.5 pills daily for one week, then 1 pill daily for one week, then stop Referral to Psychiatry for anxiety and sleep management  What is intermittent fasting? Many diets focus on what to eat, but intermittent fasting is all about when you eat.  With intermittent fasting, you only eat during a specific time. Fasting for a certain number of hours each day or eating just one meal a couple days a week, can help your body burn fat. And scientific evidence points to some health benefits, as well.  Extra calories and less activity can mean a higher risk of obesity, type 2 diabetes, heart disease and other illnesses. Scientific studies are showing that intermittent fasting may help reverse these trends.  How does intermittent fasting work? There are several different ways to do intermittent fasting, but they are all based on choosing regular time periods to eat and fast. For instance, you might try eating only during an eight-hour period each day and fast for the remainder. Or you might choose to eat only one meal a day two days a week. There are many different intermittent fasting schedules.  Intermittent fasting works by prolonging the period when your body has burned through the calories consumed during your last meal and begins burning fat.  Intermittent Fasting Plans You can pick a daily approach, which restricts daily eating to one six- to eight-hour period each day. For instance, you may choose to try 16/8 fasting: eating for eight hours and fasting for 16.   Another, known as the 5:2 approach, involves eating regularly five days a week. For the other two days, you limit yourself to one 500-600 calorie meal. An example would be if you chose to eat normally on every day of the week except Mondays  and Thursdays, which would be your one-meal days.  Longer periods without food, such as 24, 36, 48 and 72-hour fasting periods, are not necessarily better for you and may be dangerous. Going too long without eating might actually encourage your body to start storing more fat in response to starvation.  Research shows that it can take two to four weeks before the body becomes accustomed to intermittent fasting.   What can I eat while intermittent fasting? During the times when you're not eating, water and zero-calorie beverages such as black coffee and tea are permitted.  Who should not do intermittent fasting? Children and teens under age 69. Women who are pregnant or breastfeeding. People with diabetes or blood sugar problems. Those with a history of eating disorders.  Keep in mind that intermittent fasting may have different effects on different people. Talk to your doctor if you start experiencing unusual anxiety, headaches, nausea or other symptoms after you start intermittent fasting.

## 2021-06-08 ENCOUNTER — Telehealth: Payer: Self-pay

## 2021-06-08 NOTE — Telephone Encounter (Signed)
(  Key: [id number])  Your information has been sent to [employer].

## 2021-06-12 ENCOUNTER — Ambulatory Visit: Payer: 59 | Admitting: Psychiatry

## 2021-06-12 NOTE — Telephone Encounter (Signed)
PA for Tracey Ellison has been approved under the pt's [employer] benefit. PA approval has been made for 06/11/2021-12/09/2021. Pt confirmed via mychart she has been able to pick the medication up.

## 2021-06-19 ENCOUNTER — Other Ambulatory Visit: Payer: Self-pay | Admitting: Psychiatry

## 2021-06-19 MED ORDER — TRAMADOL HCL 50 MG PO TABS: 50.0000 mg | ORAL_TABLET | Freq: Four times a day (QID) | ORAL | 0 refills | Status: DC | PRN

## 2021-06-19 NOTE — Telephone Encounter (Signed)
[place] drug registry has been verified. Last refill for Tramadol was 05/22/2021 # 120 for a 30 day supply.

## 2021-06-29 ENCOUNTER — Other Ambulatory Visit: Payer: Self-pay | Admitting: Psychiatry

## 2021-07-03 NOTE — Telephone Encounter (Signed)
I won't refill her Tramadol early, but if she provides a pharmacy in the town she will be in on the [place]th I can send her next rx there

## 2021-07-05 ENCOUNTER — Other Ambulatory Visit: Payer: Self-pay | Admitting: Psychiatry

## 2021-07-05 MED ORDER — TRAMADOL HCL 50 MG PO TABS: ORAL_TABLET | ORAL | 0 refills | Status: DC

## 2021-07-06 ENCOUNTER — Telehealth: Payer: Self-pay | Admitting: *Deleted

## 2021-07-06 ENCOUNTER — Other Ambulatory Visit: Payer: Self-pay | Admitting: Psychiatry

## 2021-07-06 MED ORDER — TRAMADOL HCL 50 MG PO TABS: ORAL_TABLET | ORAL | 0 refills | Status: DC

## 2021-07-06 NOTE — Telephone Encounter (Signed)
Tramadol Rx faxed to [name], [place]. This is a temporary supply until she can pick up her next Rx due to her being out of town.Marland Kitchen

## 2021-07-10 NOTE — Telephone Encounter (Signed)
Error

## 2021-07-17 ENCOUNTER — Other Ambulatory Visit: Payer: Self-pay | Admitting: Psychiatry

## 2021-07-17 MED ORDER — TRAMADOL HCL 50 MG PO TABS: ORAL_TABLET | ORAL | 0 refills | Status: DC

## 2021-07-17 NOTE — Telephone Encounter (Signed)
Pt has requested refill of her Tramadol. Last refills were 06/19/2021 # 120 and 07/11/2021 # 12.

## 2021-07-17 NOTE — Telephone Encounter (Signed)
Rx for Tramadol taper sent to her pharmacy, thanks

## 2021-08-01 ENCOUNTER — Ambulatory Visit
Admission: EM | Admit: 2021-08-01 | Discharge: 2021-08-01 | Disposition: A | Discharge status: Home / Self Care | Payer: 59 | Attending: Emergency Medicine | Admitting: Emergency Medicine

## 2021-08-01 ENCOUNTER — Other Ambulatory Visit: Payer: Self-pay

## 2021-08-01 DIAGNOSIS — B9789 Other viral agents as the cause of diseases classified elsewhere: Secondary | ICD-10-CM | POA: Diagnosis not present

## 2021-08-01 DIAGNOSIS — R519 Headache, unspecified: Secondary | ICD-10-CM | POA: Diagnosis present

## 2021-08-01 DIAGNOSIS — J111 Influenza due to unidentified influenza virus with other respiratory manifestations: Secondary | ICD-10-CM | POA: Diagnosis present

## 2021-08-01 DIAGNOSIS — J988 Other specified respiratory disorders: Secondary | ICD-10-CM | POA: Diagnosis not present

## 2021-08-01 DIAGNOSIS — B349 Viral infection, unspecified: Secondary | ICD-10-CM

## 2021-08-01 DIAGNOSIS — J029 Acute pharyngitis, unspecified: Secondary | ICD-10-CM

## 2021-08-01 LAB — POCT RAPID STREP A (OFFICE): Rapid Strep A Screen: NEGATIVE

## 2021-08-01 MED ORDER — PROMETHAZINE-DM 6.25-15 MG/5ML PO SYRP: 5.0000 mL | ORAL_SOLUTION | Freq: Four times a day (QID) | ORAL | 0 refills | Status: DC | PRN

## 2021-08-01 MED ORDER — GUAIFENESIN 400 MG PO TABS: ORAL_TABLET | ORAL | 0 refills | Status: DC

## 2021-08-01 MED ORDER — KETOROLAC TROMETHAMINE 60 MG/2ML IM SOLN
60.0000 mg | Freq: Once | INTRAMUSCULAR | Status: AC
  Administered 2021-08-01: 60 mg via INTRAMUSCULAR

## 2021-08-01 MED ORDER — IPRATROPIUM BROMIDE 0.06 % NA SOLN
2.0000 | Freq: Four times a day (QID) | NASAL | 0 refills | Status: DC
Start: 2021-08-01 — End: 2022-01-22

## 2021-08-01 MED ORDER — OSELTAMIVIR PHOSPHATE 75 MG PO CAPS: 75.0000 mg | ORAL_CAPSULE | Freq: Two times a day (BID) | ORAL | 0 refills | Status: DC

## 2021-08-01 NOTE — ED Triage Notes (Signed)
Pt present sore throat and headache. Symptoms started on Thursday.

## 2021-08-01 NOTE — Discharge Instructions (Addendum)
Your symptoms are most consistent with a viral upper respiratory illness, in my opinion this is influenza.  The result of your COVID and influenza tests will be posted to your MyChart.  If any of the results are positive, you will be contacted by phone and further recommendations such as antiviral medications or a note to extend your time out of work/school, if needed, will be provided to you.  I provided you with a note to be out for 3 days.  Please begin Tamiflu now, 1 capsule twice daily for the next 5 days.  If you get two doses in today, taking them at least 6 hours apart, that would be great.    I also provided you with a nasal spray called Atrovent which should dry up nasal drainage and open nasal passages to help you breathe better.  I would like for you to begin Mucinex preemptively because based on your lung sounds, I believe that you are about to have a very deep, chesty cold with lots of mucus.  I have also provided you with a prescription for Promethazine DM to take at nighttime to suppress your cough provide some mild relief of nasal congestion and help you sleep better.  Please remain home from work, school, public places until you have been fever free for 24 hours without the use of antifever medications such as Tylenol or ibuprofen.  Conservative care is recommended at this time.  This includes rest, pushing clear fluids and activity as tolerated.  You may also noticed that your appetite is reduced, this is okay as long as they are drinking plenty of clear fluids.  Acetaminophen (Tylenol): This is a good fever reducer.  If there body temperature rises above 101.5 as measured with a thermometer, it is recommended that you give them 1,000 mg every 6-8 hours until they are temperature falls below 101.5, please not take more than 3,000 mg of acetaminophen either as a separate medication or as in ingredient in an over-the-counter cold/flu preparation within a 24-hour period  Ibuprofen   (Advil, Motrin): This is a good anti-inflammatory medication which addresses aches and pains and, to some degree, congestion in the nasal passages.  I recommend giving between 400 to 600 mg every 6-8 hours as needed.  Guaifenesin (Robitussin, Mucinex) (prescribed): This is an expectorant.  This helps break up chest congestion and loosen up thick nasal drainage making phlegm and drainage more liquid and therefore easier to remove.  I recommend being 400 mg three times daily as needed.  Dextromethorphan (any cough medicine with the letters "DM" added to it's name such as Robitussin DM) (prescribed): This is a cough suppressant.  This is often recommended to be taken at nighttime to suppress cough and help children sleep.  Give dosage as directed on the bottle.   Chloraseptic Throat Spray: Spray 5 sprays into affected area every 2 hours, hold for 15 seconds and either swallow or spit it out.  This is a excellent numbing medication because it is a spray, you can put it right where you needed and so sucking on a lozenge and numbing your entire mouth.  Based on my physical exam findings and the history provided  today, I do not see any evidence of bacterial infection therefore treatment with antibiotics would be of no benefit.  If you have not had improvement of cough or you continue to have worsening cough by Friday, please come back for reevaluation for possible antibiotic therapy and/or possible steroid treatment.  Please  also follow-up within the next 3 to 5 days either with your primary care provider or urgent care if your symptoms do not resolve.  If you do not have a primary care provider, we will assist you in finding one.

## 2021-08-01 NOTE — ED Provider Notes (Signed)
[facility]    CSN: [id number] Arrival date & time: 08/01/21  1013    HISTORY   Chief Complaint  Patient presents with   Sore Throat   HPI Tracey Ellison is a 30 y.o. female. Patient presents with a 2-day history of sore throat and headache.  Patient states that few days prior to this, she had some allergy symptoms and did not think a whole lot of it but late 2 nights ago, began to have very acute onset of headache and sore throat, body aches, weepy eyes and loss of appetite.  Rapid strep test today performed by clinical staff prior to evaluation is negative.  Patient states she is had strep throat in the past, states causes her to have an intense burning sensation and that sensation she is having her throat right now is just a lot of scratchiness and mild pain with swallowing.  Patient reports a history of migraines, states she has had an intense headache for the last 36 hours which did not resolve after taking Celebrex and tramadol.  Patient states she had COVID-19 in February 2022, states she did not have a headache as intense as the headache she is having right now.  Patient is her boyfriend has also been sick.  Patient also endorses a mild cough which is new as of this morning.  The history is provided by the patient.  Past Medical History:  Diagnosis Date   Chronic headaches    Chronic insomnia 05/20/2018   Intractable chronic migraine without aura 10/26/2014   Raynauds phenomenon    Thyroid disease    Patient Active Problem List   Diagnosis Date Noted   Chronic insomnia 05/20/2018   Panic disorder 08/24/2015   Intractable chronic migraine without aura 10/26/2014   Sprain of neck 05/29/2012   Headache 05/29/2012   Past Surgical History:  Procedure Laterality Date   WISDOM TOOTH EXTRACTION     OB History   No obstetric history on file.    Home Medications    Prior to Admission medications   Medication Sig Start Date End Date Taking? Authorizing Provider   cloNIDine (CATAPRES) 0.1 MG tablet Take 1 tablet (0.1 mg total) by mouth daily. 06/15/20   York Spaniel, MD  desvenlafaxine (PRISTIQ) 50 MG 24 hr tablet Take 1 tablet (50 mg total) by mouth daily. 04/19/21   York Spaniel, MD  [name] 1-20 MG-MCG(24) tablet Take 1 tablet by mouth daily. 03/11/20   [provider]  levocetirizine (XYZAL) 5 MG tablet levocetirizine 5 mg tablet  TAKE ONE TABLET BY MOUTH DAILY 12/12/20   [provider]  LORazepam (ATIVAN) 0.5 MG tablet TAKE TWO TABLETS BY MOUTH EVERY EVENING AS NEEDED FOR ANXIETY 05/30/21   York Spaniel, MD  methocarbamol (ROBAXIN) 750 MG tablet Take 1 tablet (750 mg total) by mouth 3 (three) times daily. 08/08/20   York Spaniel, MD  NP THYROID 90 MG tablet Take 90 mg by mouth daily. 04/07/20   [provider]  QUEtiapine (SEROQUEL) 50 MG tablet TAKE THREE TABLETS BY MOUTH EVERY NIGHT AT BEDTIME 04/17/21   York Spaniel, MD  QULIPTA 60 MG TABS TAKE ONE TABLET BY MOUTH DAILY 07/03/21   Ocie Doyne, MD  traMADol (ULTRAM) 50 MG tablet Take up to 3.5 pills daily as needed for one week, then up to 3 pills daily for one week, then 2.5 pills daily for one week, then 2 pills daily for one week, then  1.5 pills daily for one week, then 1 pill daily for one week, then stop. 07/17/21   Genia Harold, MD   Family History Family History  Problem Relation Age of Onset   Hypertension Father    Leukemia Neg Hx        Hx. of leukemia on father's side of family   Lung cancer Neg Hx        Hx. of lung cancer on father's side of family   Breast cancer Neg Hx        Hx. of breast cancer on father's side of family   Cancer Neg Hx        Hx. of intestinal cancer on father's side of family   Social History Social History   Tobacco Use   Smoking status: Never   Smokeless tobacco: Never  Substance Use Topics   Alcohol use: No   Drug use: No    Comment: Dilaudid withdrawal/last dose 04/15/20   Allergies    Metoclopramide hcl, Amitriptyline, Decadron [dexamethasone], and Suboxone [buprenorphine hcl-naloxone hcl]  Review of Systems Review of Systems Pertinent findings noted in history of present illness.   Physical Exam Triage Vital Signs ED Triage Vitals  Enc Vitals Group     BP 06/16/21 0827 (!) 147/82     Pulse Rate 06/16/21 0827 72     Resp 06/16/21 0827 18     Temp 06/16/21 0827 98.3 F (36.8 C)     Temp Source 06/16/21 0827 Oral     SpO2 06/16/21 0827 98 %     Weight --      Height --      Head Circumference --      Peak Flow --      Pain Score 06/16/21 0826 5     Pain Loc --      Pain Edu? --      Excl. in [place]? --   No data found.  Updated Vital Signs BP 126/84 (BP Location: Left Arm)    Pulse 91    Temp 98 F (36.7 C) (Oral)    Resp 18    LMP 07/14/2021    SpO2 98%   Physical Exam Constitutional:      Appearance: She is ill-appearing.  HENT:     Head: Normocephalic and atraumatic.     Salivary Glands: Right salivary gland is not diffusely enlarged or tender. Left salivary gland is not diffusely enlarged or tender.     Right Ear: Tympanic membrane, ear canal and external ear normal.     Left Ear: Tympanic membrane, ear canal and external ear normal.     Nose: Congestion and rhinorrhea present. Rhinorrhea is clear.     Right Sinus: No maxillary sinus tenderness or frontal sinus tenderness.     Left Sinus: No maxillary sinus tenderness.     Mouth/Throat:     Mouth: Mucous membranes are moist.     Pharynx: Pharyngeal swelling, posterior oropharyngeal erythema and uvula swelling present.     Tonsils: No tonsillar exudate. 0 on the right. 0 on the left.  Cardiovascular:     Rate and Rhythm: Normal rate and regular rhythm.     Pulses: Normal pulses.  Pulmonary:     Effort: Pulmonary effort is normal. No accessory muscle usage, prolonged expiration or respiratory distress.     Breath sounds: No stridor. No wheezing, rhonchi or rales.     Comments: Turbulent breath  sounds throughout without wheeze, rale, rhonchi. Abdominal:  General: Abdomen is flat. Bowel sounds are normal.     Palpations: Abdomen is soft.  Musculoskeletal:        General: Normal range of motion.  Lymphadenopathy:     Cervical: Cervical adenopathy present.     Right cervical: Superficial cervical adenopathy and posterior cervical adenopathy present.     Left cervical: Superficial cervical adenopathy and posterior cervical adenopathy present.  Skin:    General: Skin is warm and dry.  Neurological:     General: No focal deficit present.     Mental Status: She is alert and oriented to person, place, and time.     Motor: Motor function is intact.     Coordination: Coordination is intact.     Gait: Gait is intact.     Deep Tendon Reflexes: Reflexes are normal and symmetric.  Psychiatric:        Attention and Perception: Attention and perception normal.        Mood and Affect: Mood and affect normal.        Speech: Speech normal.        Behavior: Behavior normal. Behavior is cooperative.        Thought Content: Thought content normal.    Visual Acuity Right Eye Distance:   Left Eye Distance:   Bilateral Distance:    Right Eye Near:   Left Eye Near:    Bilateral Near:     UC Couse / Diagnostics / Procedures:    EKG  Radiology No results found.  Procedures Procedures (including critical care time)  UC Diagnoses / Final Clinical Impressions(s)   I have reviewed the triage vital signs and the nursing notes.  Pertinent labs & imaging results that were available during my care of the patient were reviewed by me and considered in my medical decision making (see chart for details).   Final diagnoses:  Acute pharyngitis  Viral respiratory infection  Viral illness  Influenza-like illness  Acute intractable headache, unspecified headache type   Patient is acutely ill in appearance today.  I believe that she has influenza at this time, I provided her with a  prescription for Tamiflu to begin now.  COVID/flu testing was performed, patient will be advised of results once received.  For symptomatic treatment, patient advised to begin Atrovent nasal spray and Promethazine DM for bedtime along with guaifenesin in the daytime.  Return precautions advised.  Patient provided with ketorolac injection for her intense and intractable headache.  Patient advised to continue Celebrex and tramadol as needed.  ED Prescriptions     Medication Sig Dispense Auth. Provider   oseltamivir (TAMIFLU) 75 MG capsule Take 1 capsule (75 mg total) by mouth every 12 (twelve) hours. 10 capsule Lynden Oxford Scales, PA-C   ipratropium (ATROVENT) 0.06 % nasal spray Place 2 sprays into both nostrils 4 (four) times daily. As needed for nasal congestion, runny nose 15 mL Lynden Oxford Scales, PA-C   guaifenesin (HUMIBID E) 400 MG TABS tablet Take 1 tablet 3 times daily as needed for chest congestion and cough 21 tablet Lynden Oxford Scales, PA-C   promethazine-dextromethorphan (PROMETHAZINE-DM) 6.25-15 MG/5ML syrup Take 5 mLs by mouth 4 (four) times daily as needed for cough. 180 mL Lynden Oxford Scales, PA-C      PDMP not reviewed this encounter.  Pending results:  Labs Reviewed  CULTURE, GROUP A STREP ([place])  COVID-19, FLU A+B NAA  POCT RAPID STREP A (OFFICE)    Medications Ordered in UC: Medications  ketorolac (TORADOL) injection  60 mg (has no administration in time range)    Disposition Upon Discharge:  Condition: stable for discharge home Home: take medications as prescribed; routine discharge instructions as discussed; follow up as advised.  Patient presented with an acute illness with associated systemic symptoms and significant discomfort requiring urgent management. In my opinion, this is a condition that a prudent lay person (someone who possesses an average knowledge of health and medicine) may potentially expect to result in complications if not addressed  urgently such as respiratory distress, impairment of bodily function or dysfunction of bodily organs.   Routine symptom specific, illness specific and/or disease specific instructions were discussed with the patient and/or caregiver at length.   As such, the patient has been evaluated and assessed, work-up was performed and treatment was provided in alignment with urgent care protocols and evidence based medicine.  Patient/parent/caregiver has been advised that the patient may require follow up for further testing and treatment if the symptoms continue in spite of treatment, as clinically indicated and appropriate.  The patient was tested for COVID-19, Influenza and/or RSV, then the patient/parent/guardian was advised to isolate at home pending the results of his/her diagnostic coronavirus test and potentially longer if theyre positive. I have also advised pt that if his/her COVID-19 test returns positive, it's recommended to self-isolate for at least 10 days after symptoms first appeared AND until fever-free for 24 hours without fever reducer AND other symptoms have improved or resolved. Discussed self-isolation recommendations as well as instructions for household member/close contacts as per the [facility] and [place] DHHS, and also gave patient the COVID packet with this information.  Patient/parent/caregiver has been advised to return to the [facility] or PCP in 3-5 days if no better; to PCP or the Emergency Department if new signs and symptoms develop, or if the current signs or symptoms continue to change or worsen for further workup, evaluation and treatment as clinically indicated and appropriate  The patient will follow up with their current PCP if and as advised. If the patient does not currently have a PCP we will assist them in obtaining one.   The patient may need specialty follow up if the symptoms continue, in spite of conservative treatment and management, for further workup, evaluation, consultation and  treatment as clinically indicated and appropriate.  Patient/parent/caregiver verbalized understanding and agreement of plan as discussed.  All questions were addressed during visit.  Please see discharge instructions below for further details of plan.  Discharge Instructions:   Discharge Instructions      Your symptoms are most consistent with a viral upper respiratory illness, in my opinion this is influenza.  The result of your COVID and influenza tests will be posted to your MyChart.  If any of the results are positive, you will be contacted by phone and further recommendations such as antiviral medications or a note to extend your time out of work/school, if needed, will be provided to you.  I provided you with a note to be out for 3 days.  Please begin Tamiflu now, 1 capsule twice daily for the next 5 days.  If you get two doses in today, taking them at least 6 hours apart, that would be great.    I also provided you with a nasal spray called Atrovent which should dry up nasal drainage and open nasal passages to help you breathe better.  I would like for you to begin Mucinex preemptively because based on your lung sounds, I believe that you are about  to have a very deep, chesty cold with lots of mucus.  I have also provided you with a prescription for Promethazine DM to take at nighttime to suppress your cough provide some mild relief of nasal congestion and help you sleep better.  Please remain home from work, school, public places until you have been fever free for 24 hours without the use of antifever medications such as Tylenol or ibuprofen.  Conservative care is recommended at this time.  This includes rest, pushing clear fluids and activity as tolerated.  You may also noticed that your appetite is reduced, this is okay as long as they are drinking plenty of clear fluids.  Acetaminophen (Tylenol): This is a good fever reducer.  If there body temperature rises above 101.5 as measured  with a thermometer, it is recommended that you give them 1,000 mg every 6-8 hours until they are temperature falls below 101.5, please not take more than 3,000 mg of acetaminophen either as a separate medication or as in ingredient in an over-the-counter cold/flu preparation within a 24-hour period  Ibuprofen  (Advil, Motrin): This is a good anti-inflammatory medication which addresses aches and pains and, to some degree, congestion in the nasal passages.  I recommend giving between 400 to 600 mg every 6-8 hours as needed.  Guaifenesin (Robitussin, Mucinex) (prescribed): This is an expectorant.  This helps break up chest congestion and loosen up thick nasal drainage making phlegm and drainage more liquid and therefore easier to remove.  I recommend being 400 mg three times daily as needed.  Dextromethorphan (any cough medicine with the letters "DM" added to it's name such as Robitussin DM) (prescribed): This is a cough suppressant.  This is often recommended to be taken at nighttime to suppress cough and help children sleep.  Give dosage as directed on the bottle.   Chloraseptic Throat Spray: Spray 5 sprays into affected area every 2 hours, hold for 15 seconds and either swallow or spit it out.  This is a excellent numbing medication because it is a spray, you can put it right where you needed and so sucking on a lozenge and numbing your entire mouth.  Based on my physical exam findings and the history provided  today, I do not see any evidence of bacterial infection therefore treatment with antibiotics would be of no benefit.  If you have not had improvement of cough or you continue to have worsening cough by Friday, please come back for reevaluation for possible antibiotic therapy and/or possible steroid treatment.  Please also follow-up within the next 3 to 5 days either with your primary care provider or urgent care if your symptoms do not resolve.  If you do not have a primary care provider, we will  assist you in finding one.         Lynden Oxford Scales, PA-C 08/01/21 1159

## 2021-08-02 LAB — COVID-19, FLU A+B NAA
Influenza A, NAA: NOT DETECTED
Influenza B, NAA: NOT DETECTED
SARS-CoV-2, NAA: NOT DETECTED

## 2021-08-03 LAB — CULTURE, GROUP A STREP (THRC)

## 2021-08-07 ENCOUNTER — Other Ambulatory Visit: Payer: Self-pay | Admitting: Psychiatry

## 2021-08-10 ENCOUNTER — Other Ambulatory Visit: Payer: Self-pay | Admitting: Psychiatry

## 2021-08-10 MED ORDER — TRAMADOL HCL 50 MG PO TABS: ORAL_TABLET | ORAL | 0 refills | Status: DC

## 2021-08-22 ENCOUNTER — Ambulatory Visit
Admission: EM | Admit: 2021-08-22 | Discharge: 2021-08-22 | Disposition: A | Discharge status: Home / Self Care | Payer: Managed Care, Other (non HMO) | Attending: Emergency Medicine | Admitting: Emergency Medicine

## 2021-08-22 ENCOUNTER — Other Ambulatory Visit: Payer: Self-pay | Admitting: Psychiatry

## 2021-08-22 ENCOUNTER — Other Ambulatory Visit: Payer: Self-pay

## 2021-08-22 DIAGNOSIS — R051 Acute cough: Secondary | ICD-10-CM

## 2021-08-22 DIAGNOSIS — G894 Chronic pain syndrome: Secondary | ICD-10-CM

## 2021-08-22 DIAGNOSIS — B349 Viral infection, unspecified: Secondary | ICD-10-CM | POA: Diagnosis not present

## 2021-08-22 MED ORDER — KETOROLAC TROMETHAMINE 60 MG/2ML IM SOLN
60.0000 mg | Freq: Once | INTRAMUSCULAR | Status: AC
  Administered 2021-08-22: 60 mg via INTRAMUSCULAR

## 2021-08-22 MED ORDER — AZITHROMYCIN 250 MG PO TABS: 250.0000 mg | ORAL_TABLET | Freq: Every day | ORAL | 0 refills | Status: DC

## 2021-08-22 MED ORDER — FLUCONAZOLE 200 MG PO TABS: 200.0000 mg | ORAL_TABLET | Freq: Every day | ORAL | 0 refills | Status: AC

## 2021-08-22 MED ORDER — METHYLPREDNISOLONE SODIUM SUCC 125 MG IJ SOLR
125.0000 mg | Freq: Once | INTRAMUSCULAR | Status: AC
  Administered 2021-08-22: 125 mg via INTRAMUSCULAR

## 2021-08-22 MED ORDER — ALBUTEROL SULFATE HFA 108 (90 BASE) MCG/ACT IN AERS: 1.0000 | INHALATION_SPRAY | Freq: Four times a day (QID) | RESPIRATORY_TRACT | 0 refills | Status: DC | PRN

## 2021-08-22 MED ORDER — PREDNISONE 10 MG (21) PO TBPK: ORAL_TABLET | Freq: Every day | ORAL | 0 refills | Status: DC

## 2021-08-22 NOTE — ED Triage Notes (Signed)
Pt reports having body aches, sore throat, cough and migraine.  Started: 2 days ago

## 2021-08-22 NOTE — ED Provider Notes (Signed)
[facility]    CSN: [id number] Arrival date & time: 08/22/21  [id number]      History   Chief Complaint No chief complaint on file.   HPI Tracey Ellison is a 31 y.o. female.   Patient is here cough congestion sore throat and headache for several weeks getting worse over the past 2 days. Was seen here and was negative for flu or coivd. She has take several things otc with no relief. She states that she just feels so bad.    Past Medical History:  Diagnosis Date   Chronic headaches    Chronic insomnia 05/20/2018   Intractable chronic migraine without aura 10/26/2014   Raynauds phenomenon    Thyroid disease     Patient Active Problem List   Diagnosis Date Noted   Chronic insomnia 05/20/2018   Panic disorder 08/24/2015   Intractable chronic migraine without aura 10/26/2014   Sprain of neck 05/29/2012   Headache 05/29/2012    Past Surgical History:  Procedure Laterality Date   WISDOM TOOTH EXTRACTION      OB History   No obstetric history on file.      Home Medications    Prior to Admission medications   Medication Sig Start Date End Date Taking? Authorizing Provider  cloNIDine (CATAPRES) 0.1 MG tablet Take 1 tablet (0.1 mg total) by mouth daily. 06/15/20   Kathrynn Ducking, MD  desvenlafaxine (PRISTIQ) 50 MG 24 hr tablet Take 1 tablet (50 mg total) by mouth daily. 04/19/21   Kathrynn Ducking, MD  guaifenesin (HUMIBID E) 400 MG TABS tablet Take 1 tablet 3 times daily as needed for chest congestion and cough 08/01/21   Lynden Oxford Scales, PA-C  [name] 1-20 MG-MCG(24) tablet Take 1 tablet by mouth daily. 03/11/20   [provider]  ipratropium (ATROVENT) 0.06 % nasal spray Place 2 sprays into both nostrils 4 (four) times daily. As needed for nasal congestion, runny nose 08/01/21   Lynden Oxford Scales, PA-C  levocetirizine (XYZAL) 5 MG tablet levocetirizine 5 mg tablet  TAKE ONE TABLET BY MOUTH DAILY 12/12/20   [provider]   LORazepam (ATIVAN) 0.5 MG tablet TAKE TWO TABLETS BY MOUTH EVERY EVENING AS NEEDED FOR ANXIETY 05/30/21   Kathrynn Ducking, MD  methocarbamol (ROBAXIN) 750 MG tablet Take 1 tablet (750 mg total) by mouth 3 (three) times daily. 08/08/20   Kathrynn Ducking, MD  NP THYROID 90 MG tablet Take 90 mg by mouth daily. 04/07/20   [provider]  oseltamivir (TAMIFLU) 75 MG capsule Take 1 capsule (75 mg total) by mouth every 12 (twelve) hours. 08/01/21   Lynden Oxford Scales, PA-C  promethazine-dextromethorphan (PROMETHAZINE-DM) 6.25-15 MG/5ML syrup Take 5 mLs by mouth 4 (four) times daily as needed for cough. 08/01/21   Lynden Oxford Scales, PA-C  QUEtiapine (SEROQUEL) 50 MG tablet TAKE THREE TABLETS BY MOUTH EVERY NIGHT AT BEDTIME 04/17/21   Kathrynn Ducking, MD  QULIPTA 60 MG TABS TAKE ONE TABLET BY MOUTH DAILY 08/07/21   Genia Harold, MD  traMADol (ULTRAM) 50 MG tablet Take 2.5 pills daily for one week, then 2 pills daily for one week, then 1.5 pills daily for one week, then 1 pill daily for one week, then stop. 08/10/21   Genia Harold, MD    Family History Family History  Problem Relation Age of Onset   Hypertension Father    Leukemia Neg Hx        Hx. of leukemia  on father's side of family   Lung cancer Neg Hx        Hx. of lung cancer on father's side of family   Breast cancer Neg Hx        Hx. of breast cancer on father's side of family   Cancer Neg Hx        Hx. of intestinal cancer on father's side of family    Social History Social History   Tobacco Use   Smoking status: Never   Smokeless tobacco: Never  Substance Use Topics   Alcohol use: No   Drug use: No    Comment: Dilaudid withdrawal/last dose 04/15/20     Allergies   Metoclopramide hcl, Amitriptyline, Decadron [dexamethasone], and Suboxone [buprenorphine hcl-naloxone hcl]   Review of Systems Review of Systems  Constitutional:  Positive for activity change, appetite change and chills. Negative  for fever.  HENT:  Positive for congestion, postnasal drip, rhinorrhea, sinus pressure, sinus pain, sneezing and sore throat.   Eyes: Negative.   Respiratory:  Positive for cough. Negative for shortness of breath.   Cardiovascular: Negative.   Gastrointestinal:  Positive for nausea. Negative for vomiting.  Genitourinary: Negative.   Neurological:  Positive for headaches.    Physical Exam Triage Vital Signs ED Triage Vitals  Enc Vitals Group     BP 08/22/21 0903 138/89     Pulse Rate 08/22/21 0903 (!) 115     Resp 08/22/21 0903 18     Temp 08/22/21 0903 99 F (37.2 C)     Temp Source 08/22/21 0903 Oral     SpO2 08/22/21 0903 96 %     Weight --      Height --      Head Circumference --      Peak Flow --      Pain Score 08/22/21 0901 10     Pain Loc --      Pain Edu? --      Excl. in [place]? --    No data found.  Updated Vital Signs BP 138/89 (BP Location: Left Arm)    Pulse (!) 115    Temp 99 F (37.2 C) (Oral)    Resp 18    SpO2 96%   Visual Acuity Right Eye Distance:   Left Eye Distance:   Bilateral Distance:    Right Eye Near:   Left Eye Near:    Bilateral Near:     Physical Exam Constitutional:      Appearance: She is ill-appearing.  HENT:     Right Ear: Tympanic membrane normal.     Left Ear: Tympanic membrane normal.     Nose: Congestion and rhinorrhea present.     Mouth/Throat:     Mouth: Mucous membranes are moist.     Pharynx: Posterior oropharyngeal erythema present.  Eyes:     Pupils: Pupils are equal, round, and reactive to light.  Cardiovascular:     Rate and Rhythm: Normal rate.  Pulmonary:     Effort: Pulmonary effort is normal.  Abdominal:     General: Abdomen is flat.  Musculoskeletal:        General: Normal range of motion.     Cervical back: Normal range of motion.  Skin:    General: Skin is warm.  Neurological:     General: No focal deficit present.     Mental Status: She is alert.     UC Treatments / Results  Labs (all labs  ordered are listed,  but only abnormal results are displayed) Labs Reviewed - No data to display  EKG   Radiology No results found.  Procedures Procedures (including critical care time)  Medications Ordered in UC Medications - No data to display  Initial Impression / Assessment and Plan / UC Course  I have reviewed the triage vital signs and the nursing notes.  Pertinent labs & imaging results that were available during my care of the patient were reviewed by me and considered in my medical decision making (see chart for details).     Continue to take over-the-counter cough and cold medication Take Tylenol and Motrin as needed pain or fever Use a humidifier to help with the cough  Take medications with food  The symptoms are viral in nature  Patient is okay with steroids it is anxiety reaction per pt   Final Clinical Impressions(s) / UC Diagnoses   Final diagnoses:  None   Discharge Instructions   None    ED Prescriptions   None    PDMP not reviewed this encounter.   Marney Setting, NP 08/22/21 [id number]

## 2021-08-23 ENCOUNTER — Other Ambulatory Visit: Payer: Self-pay | Admitting: Psychiatry

## 2021-08-23 ENCOUNTER — Telehealth: Payer: Self-pay | Admitting: Psychiatry

## 2021-08-23 MED ORDER — TRAMADOL HCL 50 MG PO TABS: ORAL_TABLET | ORAL | 0 refills | Status: AC

## 2021-08-23 NOTE — Telephone Encounter (Signed)
Pt called states she sent a mychart message regarding a refill, pt has some questions says it is time sensitive. Requesting a call back.

## 2021-08-25 ENCOUNTER — Other Ambulatory Visit: Payer: Self-pay | Admitting: Psychiatry

## 2021-08-25 DIAGNOSIS — G894 Chronic pain syndrome: Secondary | ICD-10-CM

## 2021-08-29 ENCOUNTER — Telehealth: Payer: Self-pay | Admitting: Psychiatry

## 2021-08-29 NOTE — Telephone Encounter (Signed)
Referral has been sent to [facility]/Dr. Mikael Spray per patient request. Phone: [phone] ext. [phone].

## 2021-09-04 ENCOUNTER — Ambulatory Visit: Payer: 59 | Admitting: Psychiatry

## 2021-09-15 ENCOUNTER — Ambulatory Visit
Admission: EM | Admit: 2021-09-15 | Discharge: 2021-09-15 | Disposition: A | Discharge status: Home / Self Care | Payer: Managed Care, Other (non HMO) | Attending: Emergency Medicine | Admitting: Emergency Medicine

## 2021-09-15 ENCOUNTER — Other Ambulatory Visit: Payer: Self-pay

## 2021-09-15 DIAGNOSIS — G8929 Other chronic pain: Secondary | ICD-10-CM

## 2021-09-15 DIAGNOSIS — R519 Headache, unspecified: Secondary | ICD-10-CM

## 2021-09-15 MED ORDER — SUMATRIPTAN SUCCINATE 6 MG/0.5ML ~~LOC~~ SOLN
6.0000 mg | Freq: Once | SUBCUTANEOUS | Status: AC
  Administered 2021-09-15: 6 mg via SUBCUTANEOUS

## 2021-09-15 MED ORDER — KETOROLAC TROMETHAMINE 60 MG/2ML IM SOLN
60.0000 mg | Freq: Once | INTRAMUSCULAR | Status: AC
  Administered 2021-09-15: 60 mg via INTRAMUSCULAR

## 2021-09-15 NOTE — ED Triage Notes (Signed)
Pt c/o migraine that began Tuesday.

## 2021-09-15 NOTE — ED Provider Notes (Signed)
[facility]    CSN: [id number] Arrival date & time: 09/15/21  1513    HISTORY   Chief Complaint  Patient presents with   Migraine   HPI Tracey Ellison is a 31 y.o. female. Patient complains of a migraine that began 4 days ago, states she has been taking tramadol as has been prescribed by her neurologist however states is not "touching it".  Patient is requesting a ketorolac injection today.  Patient states the migraine she is having today is similar to migraines that she has chronically.  Patient states she has had migraines for 14 years.  Patient denies acute vision changes, nausea, altered mental status, confusion.  The history is provided by the patient.  Past Medical History:  Diagnosis Date   Chronic headaches    Chronic insomnia 05/20/2018   Intractable chronic migraine without aura 10/26/2014   Raynauds phenomenon    Thyroid disease    Patient Active Problem List   Diagnosis Date Noted   Chronic insomnia 05/20/2018   Panic disorder 08/24/2015   Intractable chronic migraine without aura 10/26/2014   Sprain of neck 05/29/2012   Headache 05/29/2012   Past Surgical History:  Procedure Laterality Date   WISDOM TOOTH EXTRACTION     OB History   No obstetric history on file.    Home Medications    Prior to Admission medications   Medication Sig Start Date End Date Taking? Authorizing Provider  albuterol (VENTOLIN HFA) 108 (90 Base) MCG/ACT inhaler Inhale 1-2 puffs into the lungs every 6 (six) hours as needed for wheezing or shortness of breath. 08/22/21   Marney Setting, NP  azithromycin (ZITHROMAX) 250 MG tablet Take 1 tablet (250 mg total) by mouth daily. Take first 2 tablets together, then 1 every day until finished. 08/22/21   Marney Setting, NP  cloNIDine (CATAPRES) 0.1 MG tablet Take 1 tablet (0.1 mg total) by mouth daily. 06/15/20   Kathrynn Ducking, MD  desvenlafaxine (PRISTIQ) 50 MG 24 hr tablet Take 1 tablet (50 mg total) by mouth daily.  04/19/21   Kathrynn Ducking, MD  guaifenesin (HUMIBID E) 400 MG TABS tablet Take 1 tablet 3 times daily as needed for chest congestion and cough 08/01/21   Lynden Oxford Scales, PA-C  [name] 1-20 MG-MCG(24) tablet Take 1 tablet by mouth daily. 03/11/20   [provider]  ipratropium (ATROVENT) 0.06 % nasal spray Place 2 sprays into both nostrils 4 (four) times daily. As needed for nasal congestion, runny nose 08/01/21   Lynden Oxford Scales, PA-C  levocetirizine (XYZAL) 5 MG tablet levocetirizine 5 mg tablet  TAKE ONE TABLET BY MOUTH DAILY 12/12/20   [provider]  LORazepam (ATIVAN) 0.5 MG tablet TAKE TWO TABLETS BY MOUTH EVERY EVENING AS NEEDED FOR ANXIETY 05/30/21   Kathrynn Ducking, MD  methocarbamol (ROBAXIN) 750 MG tablet Take 1 tablet (750 mg total) by mouth 3 (three) times daily. 08/08/20   Kathrynn Ducking, MD  NP THYROID 90 MG tablet Take 90 mg by mouth daily. 04/07/20   [provider]  oseltamivir (TAMIFLU) 75 MG capsule Take 1 capsule (75 mg total) by mouth every 12 (twelve) hours. 08/01/21   Lynden Oxford Scales, PA-C  predniSONE (STERAPRED UNI-PAK 21 TAB) 10 MG (21) TBPK tablet Take by mouth daily. Take 6 tabs by mouth daily  for 2 days, then 5 tabs for 2 days, then 4 tabs for 2 days, then 3 tabs for 2 days, 2 tabs  for 2 days, then 1 tab by mouth daily for 2 days 08/22/21   Marney Setting, NP  promethazine-dextromethorphan (PROMETHAZINE-DM) 6.25-15 MG/5ML syrup Take 5 mLs by mouth 4 (four) times daily as needed for cough. 08/01/21   Lynden Oxford Scales, PA-C  QUEtiapine (SEROQUEL) 50 MG tablet TAKE THREE TABLETS BY MOUTH EVERY NIGHT AT BEDTIME 04/17/21   Kathrynn Ducking, MD  QULIPTA 60 MG TABS TAKE ONE TABLET BY MOUTH DAILY 08/07/21   Genia Harold, MD    Family History Family History  Problem Relation Age of Onset   Hypertension Father    Leukemia Neg Hx        Hx. of leukemia on father's side of family   Lung cancer Neg Hx         Hx. of lung cancer on father's side of family   Breast cancer Neg Hx        Hx. of breast cancer on father's side of family   Cancer Neg Hx        Hx. of intestinal cancer on father's side of family   Social History Social History   Tobacco Use   Smoking status: Never   Smokeless tobacco: Never  Substance Use Topics   Alcohol use: No   Drug use: No    Comment: Dilaudid withdrawal/last dose 04/15/20   Allergies   Metoclopramide hcl, Amitriptyline, Decadron [dexamethasone], and Suboxone [buprenorphine hcl-naloxone hcl]  Review of Systems Review of Systems Pertinent findings noted in history of present illness.   Physical Exam Triage Vital Signs ED Triage Vitals  Enc Vitals Group     BP 06/16/21 0827 (!) 147/82     Pulse Rate 06/16/21 0827 72     Resp 06/16/21 0827 18     Temp 06/16/21 0827 98.3 F (36.8 C)     Temp Source 06/16/21 0827 Oral     SpO2 06/16/21 0827 98 %     Weight --      Height --      Head Circumference --      Peak Flow --      Pain Score 06/16/21 0826 5     Pain Loc --      Pain Edu? --      Excl. in [place]? --   No data found.  Updated Vital Signs BP (!) 162/94 (BP Location: Left Arm)    Pulse (!) 106    Temp 97.9 F (36.6 C) (Oral)    Resp 18    SpO2 96%   Physical Exam Vitals and nursing note reviewed.  Constitutional:      General: She is not in acute distress.    Appearance: Normal appearance. She is not ill-appearing.  HENT:     Head: Normocephalic and atraumatic.  Eyes:     General: Lids are normal.        Right eye: No discharge.        Left eye: No discharge.     Extraocular Movements: Extraocular movements intact.     Conjunctiva/sclera: Conjunctivae normal.     Right eye: Right conjunctiva is not injected.     Left eye: Left conjunctiva is not injected.  Neck:     Trachea: Trachea and phonation normal.  Cardiovascular:     Rate and Rhythm: Normal rate and regular rhythm.     Pulses: Normal pulses.     Heart sounds: Normal  heart sounds. No murmur heard.   No friction rub. No gallop.  Pulmonary:  Effort: Pulmonary effort is normal. No accessory muscle usage, prolonged expiration or respiratory distress.     Breath sounds: Normal breath sounds. No stridor, decreased air movement or transmitted upper airway sounds. No decreased breath sounds, wheezing, rhonchi or rales.  Chest:     Chest wall: No tenderness.  Musculoskeletal:        General: Normal range of motion.     Cervical back: Normal range of motion and neck supple. Normal range of motion.  Lymphadenopathy:     Cervical: No cervical adenopathy.  Skin:    General: Skin is warm and dry.     Findings: No erythema or rash.  Neurological:     General: No focal deficit present.     Mental Status: She is alert and oriented to person, place, and time.     Cranial Nerves: Cranial nerves 2-12 are intact.     Sensory: Sensation is intact.     Motor: Motor function is intact.     Coordination: Coordination is intact.  Psychiatric:        Mood and Affect: Mood normal.        Behavior: Behavior normal.    Visual Acuity Right Eye Distance:   Left Eye Distance:   Bilateral Distance:    Right Eye Near:   Left Eye Near:    Bilateral Near:     UC Couse / Diagnostics / Procedures:    EKG  Radiology No results found.  Procedures Procedures (including critical care time)  UC Diagnoses / Final Clinical Impressions(s)   I have reviewed the triage vital signs and the nursing notes.  Pertinent labs & imaging results that were available during my care of the patient were reviewed by me and considered in my medical decision making (see chart for details).    Final diagnoses:  Chronic intractable headache, unspecified headache type   Patient provided with ketorolac and Zofran injections.  Patient advised to follow-up with neurology.  ED Prescriptions   None    PDMP not reviewed this encounter.  Pending results:  Labs Reviewed - No data to  display  Medications Ordered in UC: Medications  ketorolac (TORADOL) injection 60 mg (has no administration in time range)  SUMAtriptan (IMITREX) injection 6 mg (has no administration in time range)    Disposition Upon Discharge:  Condition: stable for discharge home Home: take medications as prescribed; routine discharge instructions as discussed; follow up as advised.  Patient presented with an acute illness with associated systemic symptoms and significant discomfort requiring urgent management. In my opinion, this is a condition that a prudent lay person (someone who possesses an average knowledge of health and medicine) may potentially expect to result in complications if not addressed urgently such as respiratory distress, impairment of bodily function or dysfunction of bodily organs.   Routine symptom specific, illness specific and/or disease specific instructions were discussed with the patient and/or caregiver at length.   As such, the patient has been evaluated and assessed, work-up was performed and treatment was provided in alignment with urgent care protocols and evidence based medicine.  Patient/parent/caregiver has been advised that the patient may require follow up for further testing and treatment if the symptoms continue in spite of treatment, as clinically indicated and appropriate.  If the patient was tested for COVID-19, Influenza and/or RSV, then the patient/parent/guardian was advised to isolate at home pending the results of his/her diagnostic coronavirus test and potentially longer if theyre positive. I have also advised pt that if  his/her COVID-19 test returns positive, it's recommended to self-isolate for at least 10 days after symptoms first appeared AND until fever-free for 24 hours without fever reducer AND other symptoms have improved or resolved. Discussed self-isolation recommendations as well as instructions for household member/close contacts as per the [facility] and   DHHS, and also gave patient the [place] packet with this information.  Patient/parent/caregiver has been advised to return to the [facility] or PCP in 3-5 days if no better; to PCP or the Emergency Department if new signs and symptoms develop, or if the current signs or symptoms continue to change or worsen for further workup, evaluation and treatment as clinically indicated and appropriate  The patient will follow up with their current PCP if and as advised. If the patient does not currently have a PCP we will assist them in obtaining one.   The patient may need specialty follow up if the symptoms continue, in spite of conservative treatment and management, for further workup, evaluation, consultation and treatment as clinically indicated and appropriate.   Patient/parent/caregiver verbalized understanding and agreement of plan as discussed.  All questions were addressed during visit.  Please see discharge instructions below for further details of plan.  Discharge Instructions:   Discharge Instructions      Thank you for visiting urgent care today.  I hope you feel better soon.  Please do reach out to your neurologist to let them know that the tramadol is no longer helping.      This office note has been dictated using Museum/gallery curator.  Unfortunately, and despite my best efforts, this method of dictation can sometimes lead to occasional typographical or grammatical errors.  I apologize in advance if this occurs.     Lynden Oxford Scales, PA-C 09/15/21 1546

## 2021-09-15 NOTE — Discharge Instructions (Addendum)
Thank you for visiting urgent care today.  I hope you feel better soon.  Please do reach out to your neurologist to let them know that the tramadol is no longer helping.

## 2022-01-12 ENCOUNTER — Ambulatory Visit
Admission: EM | Admit: 2022-01-12 | Discharge: 2022-01-12 | Disposition: A | Discharge status: Home / Self Care | Payer: Commercial Managed Care - HMO | Attending: Urgent Care | Admitting: Urgent Care

## 2022-01-12 DIAGNOSIS — J309 Allergic rhinitis, unspecified: Secondary | ICD-10-CM | POA: Diagnosis not present

## 2022-01-12 DIAGNOSIS — R519 Headache, unspecified: Secondary | ICD-10-CM

## 2022-01-12 DIAGNOSIS — J019 Acute sinusitis, unspecified: Secondary | ICD-10-CM | POA: Diagnosis not present

## 2022-01-12 LAB — POCT RAPID STREP A (OFFICE): Rapid Strep A Screen: NEGATIVE

## 2022-01-12 MED ORDER — FLUCONAZOLE 150 MG PO TABS
150.0000 mg | ORAL_TABLET | ORAL | 0 refills | Status: DC
Start: 2022-01-12 — End: 2022-01-22

## 2022-01-12 MED ORDER — PSEUDOEPHEDRINE HCL 60 MG PO TABS
60.0000 mg | ORAL_TABLET | Freq: Three times a day (TID) | ORAL | 0 refills | Status: DC | PRN
Start: 2022-01-12 — End: 2022-01-12

## 2022-01-12 MED ORDER — AMOXICILLIN-POT CLAVULANATE 875-125 MG PO TABS: 1.0000 | ORAL_TABLET | Freq: Two times a day (BID) | ORAL | 0 refills | Status: DC

## 2022-01-12 NOTE — ED Triage Notes (Signed)
Pt c/o headache, sweating, and hoarse voice. Started: about 2 weeks ago

## 2022-01-12 NOTE — ED Provider Notes (Signed)
[place]   MRN: [id number] DOB: 1990/09/19  Subjective:   Tracey Ellison is a 31 y.o. female presenting for 2-week history of persistent sinus headaches, chills, sweating, hoarseness of her voice and throat discomfort.  Has also had right ear pain.  Has been using Xyzal.  No cough, chest pain, shortness of breath or wheezing.  No current facility-administered medications for this encounter.  Current Outpatient Medications:    albuterol (VENTOLIN HFA) 108 (90 Base) MCG/ACT inhaler, Inhale 1-2 puffs into the lungs every 6 (six) hours as needed for wheezing or shortness of breath., Disp: 18 g, Rfl: 0   azithromycin (ZITHROMAX) 250 MG tablet, Take 1 tablet (250 mg total) by mouth daily. Take first 2 tablets together, then 1 every day until finished., Disp: 6 tablet, Rfl: 0   cloNIDine (CATAPRES) 0.1 MG tablet, Take 1 tablet (0.1 mg total) by mouth daily., Disp: 30 tablet, Rfl: 3   desvenlafaxine (PRISTIQ) 50 MG 24 hr tablet, Take 1 tablet (50 mg total) by mouth daily., Disp: 90 tablet, Rfl: 1   guaifenesin (HUMIBID E) 400 MG TABS tablet, Take 1 tablet 3 times daily as needed for chest congestion and cough, Disp: 21 tablet, Rfl: 0   [name] 1-20 MG-MCG(24) tablet, Take 1 tablet by mouth daily., Disp: , Rfl:    ipratropium (ATROVENT) 0.06 % nasal spray, Place 2 sprays into both nostrils 4 (four) times daily. As needed for nasal congestion, runny nose, Disp: 15 mL, Rfl: 0   levocetirizine (XYZAL) 5 MG tablet, levocetirizine 5 mg tablet  TAKE ONE TABLET BY MOUTH DAILY, Disp: , Rfl:    LORazepam (ATIVAN) 0.5 MG tablet, TAKE TWO TABLETS BY MOUTH EVERY EVENING AS NEEDED FOR ANXIETY, Disp: 60 tablet, Rfl: 2   methocarbamol (ROBAXIN) 750 MG tablet, Take 1 tablet (750 mg total) by mouth 3 (three) times daily., Disp: 90 tablet, Rfl: 1   NP THYROID 90 MG tablet, Take 90 mg by mouth daily., Disp: , Rfl:    oseltamivir (TAMIFLU) 75 MG capsule, Take 1 capsule (75 mg total) by  mouth every 12 (twelve) hours., Disp: 10 capsule, Rfl: 0   predniSONE (STERAPRED UNI-PAK 21 TAB) 10 MG (21) TBPK tablet, Take by mouth daily. Take 6 tabs by mouth daily  for 2 days, then 5 tabs for 2 days, then 4 tabs for 2 days, then 3 tabs for 2 days, 2 tabs for 2 days, then 1 tab by mouth daily for 2 days, Disp: 42 tablet, Rfl: 0   promethazine-dextromethorphan (PROMETHAZINE-DM) 6.25-15 MG/5ML syrup, Take 5 mLs by mouth 4 (four) times daily as needed for cough., Disp: 180 mL, Rfl: 0   QUEtiapine (SEROQUEL) 50 MG tablet, TAKE THREE TABLETS BY MOUTH EVERY NIGHT AT BEDTIME, Disp: 90 tablet, Rfl: 4   QULIPTA 60 MG TABS, TAKE ONE TABLET BY MOUTH DAILY, Disp: 30 tablet, Rfl: 0   Allergies  Allergen Reactions   Metoclopramide Hcl Anaphylaxis    hives   Amitriptyline Other (See Comments)    Dizzy, anxiety, night terrors   Decadron [Dexamethasone]     anxiety   Suboxone [Buprenorphine Hcl-Naloxone Hcl] Other (See Comments)    Chest tightness, trouble breathing, mouth swelling.     Past Medical History:  Diagnosis Date   Chronic headaches    Chronic insomnia 05/20/2018   Intractable chronic migraine without aura 10/26/2014   Raynauds phenomenon    Thyroid disease      Past Surgical History:  Procedure Laterality  Date   WISDOM TOOTH EXTRACTION      Family History  Problem Relation Age of Onset   Hypertension Father    Leukemia Neg Hx        Hx. of leukemia on father's side of family   Lung cancer Neg Hx        Hx. of lung cancer on father's side of family   Breast cancer Neg Hx        Hx. of breast cancer on father's side of family   Cancer Neg Hx        Hx. of intestinal cancer on father's side of family    Social History   Tobacco Use   Smoking status: Never   Smokeless tobacco: Never  Substance Use Topics   Alcohol use: No   Drug use: No    Comment: Dilaudid withdrawal/last dose 04/15/20    ROS   Objective:   Vitals: BP (!) 139/103 (BP Location: Left Arm)  Comment: pt denies taking bp meds. Provider aware  Pulse (!) 112   Temp 97.7 F (36.5 C) (Oral)   Resp 20   SpO2 96%   Physical Exam Constitutional:      General: She is not in acute distress.    Appearance: Normal appearance. She is well-developed and normal weight. She is not ill-appearing, toxic-appearing or diaphoretic.  HENT:     Head: Normocephalic and atraumatic.     Right Ear: Tympanic membrane, ear canal and external ear normal. No drainage or tenderness. No middle ear effusion. There is no impacted cerumen. Tympanic membrane is not erythematous.     Left Ear: Tympanic membrane, ear canal and external ear normal. No drainage or tenderness.  No middle ear effusion. There is no impacted cerumen. Tympanic membrane is not erythematous.     Nose: Congestion present. No rhinorrhea.     Comments: Nasal mucosa boggy and erythematous.    Mouth/Throat:     Mouth: Mucous membranes are moist. No oral lesions.     Pharynx: No pharyngeal swelling, oropharyngeal exudate, posterior oropharyngeal erythema or uvula swelling.     Tonsils: No tonsillar exudate or tonsillar abscesses.     Comments: Thick streaks of postnasal drainage overlying pharynx. Eyes:     General: No scleral icterus.       Right eye: No discharge.        Left eye: No discharge.     Extraocular Movements: Extraocular movements intact.     Right eye: Normal extraocular motion.     Left eye: Normal extraocular motion.     Conjunctiva/sclera: Conjunctivae normal.  Neck:     Meningeal: Brudzinski's sign and Kernig's sign absent.  Cardiovascular:     Rate and Rhythm: Normal rate.  Pulmonary:     Effort: Pulmonary effort is normal.  Musculoskeletal:     Cervical back: Normal range of motion and neck supple.  Lymphadenopathy:     Cervical: No cervical adenopathy.  Skin:    General: Skin is warm and dry.  Neurological:     General: No focal deficit present.     Mental Status: She is alert and oriented to person, place,  and time.     Cranial Nerves: No cranial nerve deficit, dysarthria or facial asymmetry.     Motor: No weakness or pronator drift.     Coordination: Romberg sign negative. Coordination normal. Finger-Nose-Finger Test and Heel to [facility] Test normal. Rapid alternating movements normal.     Gait: Gait and tandem walk normal.  Deep Tendon Reflexes: Reflexes normal.  Psychiatric:        Mood and Affect: Mood normal.        Behavior: Behavior normal.        Thought Content: Thought content normal.        Judgment: Judgment normal.    Results for orders placed or performed during the hospital encounter of 01/12/22 (from the past 24 hour(s))  POCT rapid strep A     Status: None   Collection Time: 01/12/22  1:57 PM  Result Value Ref Range   Rapid Strep A Screen Negative Negative    Assessment and Plan :   PDMP not reviewed this encounter.  1. Acute non-recurrent sinusitis, unspecified location   2. Sinus headache   3. Allergic rhinitis, unspecified seasonality, unspecified trigger    Will start empiric treatment for sinusitis with Augmentin.  Recommended supportive care otherwise including the continued use of Xyzal, and in the context of her allergies offered a 30mg  dosing of prednisone. Counseled patient on potential for adverse effects with medications prescribed/recommended today, ER and return-to-clinic precautions discussed, patient verbalized understanding.    Jaynee Eagles, PA-C 01/12/22 1507

## 2022-01-22 ENCOUNTER — Ambulatory Visit
Admission: EM | Admit: 2022-01-22 | Discharge: 2022-01-22 | Disposition: A | Discharge status: Home / Self Care | Payer: Commercial Managed Care - HMO | Attending: Nurse Practitioner | Admitting: Nurse Practitioner

## 2022-01-22 ENCOUNTER — Encounter: Payer: Self-pay | Admitting: Emergency Medicine

## 2022-01-22 DIAGNOSIS — J329 Chronic sinusitis, unspecified: Secondary | ICD-10-CM | POA: Diagnosis not present

## 2022-01-22 DIAGNOSIS — B9789 Other viral agents as the cause of diseases classified elsewhere: Secondary | ICD-10-CM

## 2022-01-22 MED ORDER — CHLORPHEN-PE-ACETAMINOPHEN 4-10-325 MG PO TABS: 1.0000 | ORAL_TABLET | Freq: Three times a day (TID) | ORAL | 0 refills | Status: AC

## 2022-01-22 MED ORDER — METHYLPREDNISOLONE SODIUM SUCC 125 MG IJ SOLR: 80.0000 mg | Freq: Once | INTRAMUSCULAR | Status: DC

## 2022-01-22 MED ORDER — DEXAMETHASONE SODIUM PHOSPHATE 10 MG/ML IJ SOLN
10.0000 mg | Freq: Once | INTRAMUSCULAR | Status: AC
  Administered 2022-01-22: 10 mg via INTRAMUSCULAR

## 2022-01-22 NOTE — ED Triage Notes (Addendum)
Pt here with sore throat and headache since yesterday. Was put on abx a week ago and is requesting steroid injection and toradol. Does not want strep test.

## 2022-01-22 NOTE — Discharge Instructions (Signed)
Your symptoms are likely due to a viral infection. A respiratory infection is an illness that affects part of the respiratory system, such as the lungs, nose, or throat. Antibiotic medicines are not prescribed for viral infections. This is because antibiotics are designed to kill bacteria. They do not kill viruses. Take medications as prescribed. You may take tylenol or ibuprofen as needed for fevers/headache/body aches. Drink plenty of fluids. Use saline nasal rinses/Neti pot. Flonase daily. Go to the ED immediately if you get worse or have any other symptoms.    Feel better soon!  Lelon Mast, FNP-C

## 2022-01-22 NOTE — ED Provider Notes (Signed)
[facility]    CSN: [id number] Arrival date & time: 01/22/22  0825      History   Chief Complaint Chief Complaint  Patient presents with   Sore Throat   Headache    HPI Tracey Ellison is a 31 y.o. female.   Subjective:   Tracey Ellison is a 31 y.o. female who presents for evaluation of possible sinus infection. Symptoms include achiness, headache, facial pressure, post nasal drip, sinus pressure, sneezing, and sore throat with no fever, chills, night sweats or weight loss. Onset of symptoms was 1 day ago and has been gradually worsening since that time.  Patient was seen on 01/12/2022 for similar symptoms.  She was treated with antibiotics for a sinus infection.  Patient states that she got better for a couple of days after completing the medications and then her symptoms returned.  She took a over-the-counter cold medication last night and she is drinking plenty of fluids.  Past history is significant for no history of pneumonia or bronchitis. Patient is a non-smoker.  Patient reports that she is going to [place] at the end of the week and wants to "knock it out" before she leaves.  Patient is specifically asking for a steroid injection in the clinic.  The following portions of the patient's history were reviewed and updated as appropriate: allergies, current medications, past family history, past medical history, past social history, past surgical history, and problem list.      Past Medical History:  Diagnosis Date   Chronic headaches    Chronic insomnia 05/20/2018   Intractable chronic migraine without aura 10/26/2014   Raynauds phenomenon    Thyroid disease     Patient Active Problem List   Diagnosis Date Noted   Chronic insomnia 05/20/2018   Panic disorder 08/24/2015   Intractable chronic migraine without aura 10/26/2014   Sprain of neck 05/29/2012   Headache 05/29/2012    Past Surgical History:  Procedure Laterality Date   WISDOM TOOTH EXTRACTION       OB History   No obstetric history on file.      Home Medications    Prior to Admission medications   Medication Sig Start Date End Date Taking? Authorizing Provider  Chlorphen-PE-Acetaminophen 4-10-325 MG TABS Take 1 tablet by mouth 3 (three) times daily. 01/22/22  Yes Lurline Idol, FNP  cloNIDine (CATAPRES) 0.1 MG tablet Take 1 tablet (0.1 mg total) by mouth daily. 06/15/20   York Spaniel, MD  desvenlafaxine (PRISTIQ) 50 MG 24 hr tablet Take 1 tablet (50 mg total) by mouth daily. 04/19/21   York Spaniel, MD  [name] 1-20 MG-MCG(24) tablet Take 1 tablet by mouth daily. 03/11/20   [provider]  levocetirizine (XYZAL) 5 MG tablet levocetirizine 5 mg tablet  TAKE ONE TABLET BY MOUTH DAILY 12/12/20   [provider]  LORazepam (ATIVAN) 0.5 MG tablet TAKE TWO TABLETS BY MOUTH EVERY EVENING AS NEEDED FOR ANXIETY 05/30/21   York Spaniel, MD  NP THYROID 90 MG tablet Take 90 mg by mouth daily. 04/07/20   [provider]  QUEtiapine (SEROQUEL) 50 MG tablet TAKE THREE TABLETS BY MOUTH EVERY NIGHT AT BEDTIME 04/17/21   York Spaniel, MD    Family History Family History  Problem Relation Age of Onset   Hypertension Father    Leukemia Neg Hx        Hx. of leukemia on father's side of family   Lung cancer Neg Hx  Hx. of lung cancer on father's side of family   Breast cancer Neg Hx        Hx. of breast cancer on father's side of family   Cancer Neg Hx        Hx. of intestinal cancer on father's side of family    Social History Social History   Tobacco Use   Smoking status: Never   Smokeless tobacco: Never  Substance Use Topics   Alcohol use: No   Drug use: No    Comment: Dilaudid withdrawal/last dose 04/15/20     Allergies   Metoclopramide hcl, Amitriptyline, Decadron [dexamethasone], and Suboxone [buprenorphine hcl-naloxone hcl]   Review of Systems Review of Systems  Constitutional:  Negative for chills, fatigue  and fever.  HENT:  Positive for postnasal drip, sinus pressure, sneezing and sore throat. Negative for congestion, ear pain and rhinorrhea.   Eyes:  Negative for discharge, redness and itching.  Respiratory:  Negative for cough.   Gastrointestinal:  Negative for diarrhea, nausea and vomiting.  Neurological:  Positive for headaches.  All other systems reviewed and are negative.   Physical Exam Triage Vital Signs ED Triage Vitals  Enc Vitals Group     BP 01/22/22 0839 (!) 154/93     Pulse Rate 01/22/22 0839 (!) 101     Resp 01/22/22 0839 20     Temp 01/22/22 0839 98.2 F (36.8 C)     Temp src --      SpO2 01/22/22 0839 98 %     Weight --      Height --      Head Circumference --      Peak Flow --      Pain Score 01/22/22 0837 5     Pain Loc --      Pain Edu? --      Excl. in [place]? --    No data found.  Updated Vital Signs BP (!) 154/93   Pulse (!) 101   Temp 98.2 F (36.8 C)   Resp 20   SpO2 98%   Visual Acuity Right Eye Distance:   Left Eye Distance:   Bilateral Distance:    Right Eye Near:   Left Eye Near:    Bilateral Near:     Physical Exam Vitals and nursing note reviewed.  Constitutional:      General: She is not in acute distress.    Appearance: She is well-developed. She is not ill-appearing, toxic-appearing or diaphoretic.  HENT:     Head: Normocephalic.     Right Ear: Tympanic membrane and ear canal normal.     Left Ear: Tympanic membrane and ear canal normal.     Nose: No congestion.     Mouth/Throat:     Mouth: Mucous membranes are moist.     Pharynx: Oropharynx is clear.  Eyes:     Conjunctiva/sclera: Conjunctivae normal.     Pupils: Pupils are equal, round, and reactive to light.  Cardiovascular:     Rate and Rhythm: Normal rate.     Heart sounds: Normal heart sounds.  Pulmonary:     Effort: Pulmonary effort is normal.     Breath sounds: Normal breath sounds.  Abdominal:     Palpations: Abdomen is soft.  Musculoskeletal:     Cervical  back: Normal range of motion and neck supple.  Lymphadenopathy:     Cervical: No cervical adenopathy.  Skin:    General: Skin is warm and dry.  Neurological:  General: No focal deficit present.     Mental Status: She is alert and oriented to person, place, and time.  Psychiatric:        Mood and Affect: Mood normal.        Behavior: Behavior normal.     UC Treatments / Results  Labs (all labs ordered are listed, but only abnormal results are displayed) Labs Reviewed - No data to display  EKG   Radiology No results found.  Procedures Procedures (including critical care time)  Medications Ordered in UC Medications  dexamethasone (DECADRON) injection 10 mg (10 mg Intramuscular Given 01/22/22 0906)    Initial Impression / Assessment and Plan / UC Course  I have reviewed the triage vital signs and the nursing notes.  Pertinent labs & imaging results that were available during my care of the patient were reviewed by me and considered in my medical decision making (see chart for details).    31 year old female presenting with acute sinusitis.  She is afebrile and nontoxic.  Symptoms likely due to a viral versus bacterial process.  Advised against antibiotics at this time.  Supportive care measures discussed.  Patient would like a steroid injection in the clinic.  Patient has a documented allergy to Decadron.  Patient states that any oral steroid causes her anxiety but she can tolerate injections without any difficulty.  Decadron 10 mg IM given in the clinic.  Norel AD 3 times a day, over-the-counter Flonase and nasal rinses advised.  Discussed diagnosis, treatment and indications for follow-up with patient.  Patient agreeable.  Today's evaluation has revealed no signs of a dangerous process. Discussed diagnosis with patient and/or guardian. Patient and/or guardian aware of their diagnosis, possible red flag symptoms to watch out for and need for close follow up. Patient and/or  guardian understands verbal and written discharge instructions. Patient and/or guardian comfortable with plan and disposition.  Patient and/or guardian has a clear mental status at this time, good insight into illness (after discussion and teaching) and has clear judgment to make decisions regarding their care  Documentation was completed with the aid of voice recognition software. Transcription may contain typographical errors. Final Clinical Impressions(s) / UC Diagnoses   Final diagnoses:  Viral sinusitis     Discharge Instructions      Your symptoms are likely due to a viral infection. A respiratory infection is an illness that affects part of the respiratory system, such as the lungs, nose, or throat. Antibiotic medicines are not prescribed for viral infections. This is because antibiotics are designed to kill bacteria. They do not kill viruses. Take medications as prescribed. You may take tylenol or ibuprofen as needed for fevers/headache/body aches. Drink plenty of fluids. Use saline nasal rinses/Neti pot. Flonase daily. Go to the ED immediately if you get worse or have any other symptoms.    Feel better soon!  Lelon Mast, FNP-C        ED Prescriptions     Medication Sig Dispense Auth. Provider   Chlorphen-PE-Acetaminophen 4-10-325 MG TABS Take 1 tablet by mouth 3 (three) times daily. 21 tablet Lurline Idol, FNP      PDMP not reviewed this encounter.   Cathlean Sauer [place], [place] 01/22/22 [id number]

## 2022-08-10 ENCOUNTER — Ambulatory Visit
Admission: RE | Admit: 2022-08-10 | Discharge: 2022-08-10 | Disposition: A | Discharge status: Home / Self Care | Payer: Commercial Managed Care - HMO | Source: Ambulatory Visit | Attending: Urgent Care | Admitting: Urgent Care

## 2022-08-10 ENCOUNTER — Ambulatory Visit: Payer: Commercial Managed Care - HMO

## 2022-08-10 VITALS — BP 136/102 | HR 120 | Temp 97.7°F | Resp 17

## 2022-08-10 DIAGNOSIS — G43109 Migraine with aura, not intractable, without status migrainosus: Secondary | ICD-10-CM | POA: Diagnosis not present

## 2022-08-10 MED ORDER — DEXAMETHASONE SODIUM PHOSPHATE 10 MG/ML IJ SOLN
10.0000 mg | Freq: Once | INTRAMUSCULAR | Status: AC
  Administered 2022-08-10: 10 mg via INTRAMUSCULAR

## 2022-08-10 MED ORDER — KETOROLAC TROMETHAMINE 60 MG/2ML IM SOLN
60.0000 mg | Freq: Once | INTRAMUSCULAR | Status: AC
  Administered 2022-08-10: 60 mg via INTRAMUSCULAR

## 2022-08-10 MED ORDER — ONDANSETRON HCL 4 MG/2ML IJ SOLN
4.0000 mg | Freq: Once | INTRAMUSCULAR | Status: AC
  Administered 2022-08-10: 4 mg via INTRAMUSCULAR

## 2022-08-10 MED ORDER — KETOROLAC TROMETHAMINE 30 MG/ML IJ SOLN: 60.0000 mg | Freq: Once | INTRAMUSCULAR | Status: DC

## 2022-08-10 NOTE — ED Triage Notes (Signed)
Pt presents with migraines that has been going on for 6 days. Tramadol is not helping.

## 2022-08-10 NOTE — ED Provider Notes (Signed)
[facility]  Note:  This document was prepared using Conservation officer, historic buildings and may include unintentional dictation errors.  MRN: [id number] DOB: February 21, 1991  Subjective:   Tracey Ellison is a 31 y.o. female presenting for 6-day history of acute onset persistent posterior migraine headache.  Patient has had these for years and has had multiple MRIs done over the years.  All have been negative.  She has worked with multiple headache specialist.  Currently she is working with a Garment/textile technologist.  She has hopes that they will get her headaches under control.  Unfortunately she has not responded to most migraine medications.  She was recently seen and underwent a Toradol injection.  It helped very temporarily.  She did not get any other treatment.  Has been using her typical regimen at home which does not include the common migraine medications.  She previously failed these treatments.  No current facility-administered medications for this encounter.  Current Outpatient Medications:    Chlorphen-PE-Acetaminophen 4-10-325 MG TABS, Take 1 tablet by mouth 3 (three) times daily., Disp: 21 tablet, Rfl: 0   cloNIDine (CATAPRES) 0.1 MG tablet, Take 1 tablet (0.1 mg total) by mouth daily., Disp: 30 tablet, Rfl: 3   desvenlafaxine (PRISTIQ) 50 MG 24 hr tablet, Take 1 tablet (50 mg total) by mouth daily., Disp: 90 tablet, Rfl: 1   [name] 1-20 MG-MCG(24) tablet, Take 1 tablet by mouth daily., Disp: , Rfl:    levocetirizine (XYZAL) 5 MG tablet, levocetirizine 5 mg tablet  TAKE ONE TABLET BY MOUTH DAILY, Disp: , Rfl:    LORazepam (ATIVAN) 0.5 MG tablet, TAKE TWO TABLETS BY MOUTH EVERY EVENING AS NEEDED FOR ANXIETY, Disp: 60 tablet, Rfl: 2   NP THYROID 90 MG tablet, Take 90 mg by mouth daily., Disp: , Rfl:    QUEtiapine (SEROQUEL) 50 MG tablet, TAKE THREE TABLETS BY MOUTH EVERY NIGHT AT BEDTIME, Disp: 90 tablet, Rfl: 4   Allergies  Allergen Reactions   Metoclopramide  Hcl Anaphylaxis    hives   Amitriptyline Other (See Comments)    Dizzy, anxiety, night terrors   Decadron [Dexamethasone]     Oral steroids causes her to have anxiety.  Patient reports that she can tolerate steroid injections without any difficulty    Suboxone [Buprenorphine Hcl-Naloxone Hcl] Other (See Comments)    Chest tightness, trouble breathing, mouth swelling.     Past Medical History:  Diagnosis Date   Chronic headaches    Chronic insomnia 05/20/2018   Intractable chronic migraine without aura 10/26/2014   Raynauds phenomenon    Thyroid disease      Past Surgical History:  Procedure Laterality Date   WISDOM TOOTH EXTRACTION      Family History  Problem Relation Age of Onset   Hypertension Father    Leukemia Neg Hx        Hx. of leukemia on father's side of family   Lung cancer Neg Hx        Hx. of lung cancer on father's side of family   Breast cancer Neg Hx        Hx. of breast cancer on father's side of family   Cancer Neg Hx        Hx. of intestinal cancer on father's side of family    Social History   Tobacco Use   Smoking status: Never   Smokeless tobacco: Never  Substance Use Topics   Alcohol use: No   Drug use:  No    Comment: Dilaudid withdrawal/last dose 04/15/20    ROS   Objective:   Vitals: BP (!) 136/102   Pulse (!) 120 Comment: pt crying during intake  Temp 97.7 F (36.5 C)   Resp 17   SpO2 98%   Physical Exam Constitutional:      General: She is not in acute distress.    Appearance: Normal appearance. She is well-developed and normal weight. She is not ill-appearing, toxic-appearing or diaphoretic.  HENT:     Head: Normocephalic and atraumatic.     Right Ear: Tympanic membrane, ear canal and external ear normal. No drainage or tenderness. No middle ear effusion. There is no impacted cerumen. Tympanic membrane is not erythematous or bulging.     Left Ear: Tympanic membrane, ear canal and external ear normal. No drainage or  tenderness.  No middle ear effusion. There is no impacted cerumen. Tympanic membrane is not erythematous or bulging.     Nose: Nose normal. No congestion or rhinorrhea.     Mouth/Throat:     Mouth: Mucous membranes are moist. No oral lesions.     Pharynx: No pharyngeal swelling, oropharyngeal exudate, posterior oropharyngeal erythema or uvula swelling.     Tonsils: No tonsillar exudate or tonsillar abscesses.  Eyes:     General: No scleral icterus.       Right eye: No discharge.        Left eye: No discharge.     Extraocular Movements: Extraocular movements intact.     Right eye: Normal extraocular motion.     Left eye: Normal extraocular motion.     Conjunctiva/sclera: Conjunctivae normal.  Cardiovascular:     Rate and Rhythm: Normal rate.  Pulmonary:     Effort: Pulmonary effort is normal.  Musculoskeletal:     Cervical back: Normal range of motion and neck supple.  Lymphadenopathy:     Cervical: No cervical adenopathy.  Skin:    General: Skin is warm and dry.  Neurological:     General: No focal deficit present.     Mental Status: She is alert and oriented to person, place, and time.     Cranial Nerves: No cranial nerve deficit.     Motor: No weakness.     Coordination: Coordination normal.     Gait: Gait normal.     Deep Tendon Reflexes: Reflexes normal.     Comments: Negative Romberg and pronator drift.  Negative Kernig and Brudzinski.  Psychiatric:        Mood and Affect: Mood normal.        Behavior: Behavior normal.        Thought Content: Thought content normal.        Judgment: Judgment normal.    IM Toradol at 60 mg, IM dexamethasone 10 mg, IM Zofran at 4 mg.  Assessment and Plan :   PDMP not reviewed this encounter.  1. Migraine with aura and without status migrainosus, not intractable     Migraine medications administered as above.  Patient has typically done well or better with the injectable steroids.  No oral steroids were prescribed.  I offered  patient other typical migraine medications as outpatient prescription she declined these that she has not responded well previously.  No signs of an acute encephalopathy.  Maintain close follow-up with her current headache specialist. Counseled patient on potential for adverse effects with medications prescribed/recommended today, ER and return-to-clinic precautions discussed, patient verbalized understanding.    Wallis Bamberg, PA-C 08/10/22 1248

## 2022-09-10 ENCOUNTER — Ambulatory Visit
Admission: EM | Admit: 2022-09-10 | Discharge: 2022-09-10 | Disposition: A | Discharge status: Home / Self Care | Payer: BC Managed Care – PPO | Attending: Nurse Practitioner | Admitting: Nurse Practitioner

## 2022-09-10 DIAGNOSIS — R52 Pain, unspecified: Secondary | ICD-10-CM | POA: Insufficient documentation

## 2022-09-10 DIAGNOSIS — B349 Viral infection, unspecified: Secondary | ICD-10-CM | POA: Insufficient documentation

## 2022-09-10 DIAGNOSIS — R110 Nausea: Secondary | ICD-10-CM | POA: Insufficient documentation

## 2022-09-10 DIAGNOSIS — J029 Acute pharyngitis, unspecified: Secondary | ICD-10-CM | POA: Insufficient documentation

## 2022-09-10 LAB — POCT RAPID STREP A (OFFICE): Rapid Strep A Screen: NEGATIVE

## 2022-09-10 MED ORDER — KETOROLAC TROMETHAMINE 30 MG/ML IJ SOLN
30.0000 mg | Freq: Once | INTRAMUSCULAR | Status: AC
  Administered 2022-09-10: 30 mg via INTRAMUSCULAR

## 2022-09-10 MED ORDER — ONDANSETRON HCL 4 MG/2ML IJ SOLN
4.0000 mg | Freq: Once | INTRAMUSCULAR | Status: AC
  Administered 2022-09-10: 4 mg via INTRAMUSCULAR

## 2022-09-10 NOTE — ED Provider Notes (Addendum)
[facility]    CSN: [id number] Arrival date & time: 09/10/22  1802      History   Chief Complaint Chief Complaint  Patient presents with   Chills   Headache   Sore Throat    HPI LORANN Tracey Ellison is a 32 y.o. female who presents for evaluation of URI symptoms for 3 days. Patient reports associated symptoms of bodyaches, congestion, sore throat, headache, chills, nausea. Denies vomiting, diarrhea, documented fevers, cough, ear pain, shortness of breath. Patient does not have a hx of asthma or smoking. No known sick contacts.  Patient has been traveling out of state for work.  Pt is vaccinated for COVID. Pt is vaccinated for flu this season. Pt has taken Tylenol and Motrin OTC for symptoms. Pt has no other concerns at this time.    Headache Associated symptoms: congestion, myalgias and sore throat   Sore Throat Associated symptoms include headaches.    Past Medical History:  Diagnosis Date   Chronic headaches    Chronic insomnia 05/20/2018   Intractable chronic migraine without aura 10/26/2014   Raynauds phenomenon    Thyroid disease     Patient Active Problem List   Diagnosis Date Noted   Chronic insomnia 05/20/2018   Panic disorder 08/24/2015   Intractable chronic migraine without aura 10/26/2014   Sprain of neck 05/29/2012   Headache 05/29/2012    Past Surgical History:  Procedure Laterality Date   WISDOM TOOTH EXTRACTION      OB History   No obstetric history on file.      Home Medications    Prior to Admission medications   Medication Sig Start Date End Date Taking? Authorizing Provider  Chlorphen-PE-Acetaminophen 4-10-325 MG TABS Take 1 tablet by mouth 3 (three) times daily. 01/22/22   Enrique Sack, FNP  cloNIDine (CATAPRES) 0.1 MG tablet Take 1 tablet (0.1 mg total) by mouth daily. 06/15/20   Kathrynn Ducking, MD  desvenlafaxine (PRISTIQ) 50 MG 24 hr tablet Take 1 tablet (50 mg total) by mouth daily. 04/19/21   Kathrynn Ducking, MD   [name] 1-20 MG-MCG(24) tablet Take 1 tablet by mouth daily. 03/11/20   [provider]  levocetirizine (XYZAL) 5 MG tablet levocetirizine 5 mg tablet  TAKE ONE TABLET BY MOUTH DAILY 12/12/20   [provider]  LORazepam (ATIVAN) 0.5 MG tablet TAKE TWO TABLETS BY MOUTH EVERY EVENING AS NEEDED FOR ANXIETY 05/30/21   Kathrynn Ducking, MD  NP THYROID 90 MG tablet Take 90 mg by mouth daily. 04/07/20   [provider]  QUEtiapine (SEROQUEL) 50 MG tablet TAKE THREE TABLETS BY MOUTH EVERY NIGHT AT BEDTIME 04/17/21   Kathrynn Ducking, MD    Family History Family History  Problem Relation Age of Onset   Hypertension Father    Leukemia Neg Hx        Hx. of leukemia on father's side of family   Lung cancer Neg Hx        Hx. of lung cancer on father's side of family   Breast cancer Neg Hx        Hx. of breast cancer on father's side of family   Cancer Neg Hx        Hx. of intestinal cancer on father's side of family    Social History Social History   Tobacco Use   Smoking status: Never   Smokeless tobacco: Never  Substance Use Topics   Alcohol use: No   Drug use:  No    Comment: Dilaudid withdrawal/last dose 04/15/20     Allergies   Metoclopramide hcl, Amitriptyline, Decadron [dexamethasone], and Suboxone [buprenorphine hcl-naloxone hcl]   Review of Systems Review of Systems  Constitutional:  Positive for chills.  HENT:  Positive for congestion and sore throat.   Musculoskeletal:  Positive for myalgias.  Neurological:  Positive for headaches.     Physical Exam Triage Vital Signs ED Triage Vitals  Enc Vitals Group     BP 09/10/22 1814 (!) 138/100     Pulse Rate 09/10/22 1814 (!) 111     Resp 09/10/22 1814 18     Temp 09/10/22 1814 99 F (37.2 C)     Temp Source 09/10/22 1814 Oral     SpO2 09/10/22 1814 97 %     Weight --      Height --      Head Circumference --      Peak Flow --      Pain Score 09/10/22 1813 6     Pain Loc --       Pain Edu? --      Excl. in [place]? --    No data found.  Updated Vital Signs BP (!) 138/100 (BP Location: Right Arm)   Pulse (!) 111   Temp 99 F (37.2 C) (Oral)   Resp 18   SpO2 97%   Visual Acuity Right Eye Distance:   Left Eye Distance:   Bilateral Distance:    Right Eye Near:   Left Eye Near:    Bilateral Near:     Physical Exam Vitals and nursing note reviewed.  Constitutional:      General: She is not in acute distress.    Appearance: She is well-developed. She is not ill-appearing.  HENT:     Head: Normocephalic and atraumatic.     Right Ear: Tympanic membrane and ear canal normal.     Left Ear: Tympanic membrane and ear canal normal.     Nose: Congestion present.     Mouth/Throat:     Mouth: Mucous membranes are moist.     Pharynx: Oropharynx is clear. Uvula midline. Posterior oropharyngeal erythema present.     Tonsils: No tonsillar exudate or tonsillar abscesses.  Eyes:     Conjunctiva/sclera: Conjunctivae normal.     Pupils: Pupils are equal, round, and reactive to light.  Cardiovascular:     Rate and Rhythm: Normal rate and regular rhythm.     Heart sounds: Normal heart sounds.  Pulmonary:     Effort: Pulmonary effort is normal.     Breath sounds: Normal breath sounds.  Musculoskeletal:     Cervical back: Normal range of motion and neck supple.  Lymphadenopathy:     Cervical: No cervical adenopathy.  Skin:    General: Skin is warm and dry.  Neurological:     General: No focal deficit present.     Mental Status: She is alert and oriented to person, place, and time.  Psychiatric:        Mood and Affect: Mood normal.        Behavior: Behavior normal.      UC Treatments / Results  Labs (all labs ordered are listed, but only abnormal results are displayed) Labs Reviewed  CULTURE, GROUP A STREP [facility])    EKG   Radiology No results found.  Procedures Procedures (including critical care time)  Medications Ordered in UC Medications   ondansetron (ZOFRAN) injection 4 mg (has no administration in  time range)  ketorolac (TORADOL) 30 MG/ML injection 30 mg (has no administration in time range)    Initial Impression / Assessment and Plan / UC Course  I have reviewed the triage vital signs and the nursing notes.  Pertinent labs & imaging results that were available during my care of the patient were reviewed by me and considered in my medical decision making (see chart for details).     Reviewed exam and symptoms with patient.  No red flags on exam. Negative rapid strep will culture Patient declined COVID PCR Patient requested IM Toradol and Zofran for her body aches/nausea.  States she has had this in the past many times for her migraines.    She was instructed no NSAIDs for 24 hours and she verbalized understanding Rest and fluids Continue over-the-counter Tylenol as needed PCP follow-up 2 to 3 days for recheck ER precautions reviewed and patient verbalized understanding Final Clinical Impressions(s) / UC Diagnoses   Final diagnoses:  Body aches  Nausea  Viral illness  Sore throat     Discharge Instructions      You were given a Toradol injection in clinic today. Do not take any over the counter NSAID's such as Advil, ibuprofen, Aleve, or naproxen for 24 hours.  You may take tylenol if needed Your symptoms and exam are consistent for a viral illness. Please treat your symptoms with over the counter cough medication, tylenol or ibuprofen, humidifier, and rest. Viral illnesses can last 7-14 days. Please follow up with your PCP if your symptoms are not improving. Please go to the ER for any worsening symptoms. This includes but is not limited to fever you can not control with tylenol or ibuprofen, you are not able to stay hydrated, you have shortness of breath or chest pain.  Thank you for choosing [facility] for your healthcare needs. I hope you feel better soon!     ED Prescriptions   None    PDMP not  reviewed this encounter.   Radford Pax, NP 09/10/22 1842    Radford Pax, NP 09/10/22 1900

## 2022-09-10 NOTE — Discharge Instructions (Signed)
You were given a Toradol injection in clinic today. Do not take any over the counter NSAID's such as Advil, ibuprofen, Aleve, or naproxen for 24 hours.  You may take tylenol if needed Your symptoms and exam are consistent for a viral illness. Please treat your symptoms with over the counter cough medication, tylenol or ibuprofen, humidifier, and rest. Viral illnesses can last 7-14 days. Please follow up with your PCP if your symptoms are not improving. Please go to the ER for any worsening symptoms. This includes but is not limited to fever you can not control with tylenol or ibuprofen, you are not able to stay hydrated, you have shortness of breath or chest pain.  Thank you for choosing [facility] for your healthcare needs. I hope you feel better soon!

## 2022-09-10 NOTE — ED Triage Notes (Signed)
Pt c/o sore throat, headache, chills, and body aches  that began Saturday.  Home interventions: tylenol, motrin

## 2022-09-13 LAB — CULTURE, GROUP A STREP (THRC)

## 2022-09-25 ENCOUNTER — Telehealth: Payer: Self-pay

## 2022-09-25 NOTE — Telephone Encounter (Signed)
Patient verification complete (name and date of birth).   Returned patient call. patient reports still having sore throat and headache (same c/c from prior visit on 09/10/22). Per provider it is recommended that the patient come back in to be re-evaluated and if this is not possible other options that may be more accsessable would be to use telehealth. All question answered.

## 2022-09-26 ENCOUNTER — Ambulatory Visit
Admission: RE | Admit: 2022-09-26 | Discharge: 2022-09-26 | Disposition: A | Discharge status: Home / Self Care | Payer: BC Managed Care – PPO | Source: Ambulatory Visit | Attending: Urgent Care | Admitting: Urgent Care

## 2022-09-26 VITALS — BP 135/98 | HR 85 | Temp 98.7°F | Resp 20

## 2022-09-26 DIAGNOSIS — J018 Other acute sinusitis: Secondary | ICD-10-CM

## 2022-09-26 MED ORDER — AMOXICILLIN-POT CLAVULANATE 875-125 MG PO TABS: 1.0000 | ORAL_TABLET | Freq: Two times a day (BID) | ORAL | 0 refills | Status: DC

## 2022-09-26 NOTE — ED Triage Notes (Signed)
Pt c/o scratchy throat and HA-state she was seen here for same 1/22-NAD-steady gait

## 2022-09-26 NOTE — Discharge Instructions (Addendum)
We will manage this for a sinus infection with amoxicillin-clavulanate. For sore throat or cough try using a honey-based tea. Use 3 teaspoons of honey with juice squeezed from half lemon. Place shaved pieces of ginger into 1/2-1 cup of water and warm over stove top. Then mix the ingredients and repeat every 4 hours as needed. Please take ibuprofen 685m every 6 hours with food alternating with OR taken together with Tylenol 507m650mg every 6 hours for throat pain, fevers, aches and pains. Hydrate very well with at least 2 liters of water. Eat light meals such as soups (chicken and noodles, vegetable, chicken and wild rice).  Do not eat foods that you are allergic to.  Taking an antihistamine like Zyrtec (1040maily) can help against postnasal drainage, sinus congestion which can cause sinus pain, sinus headaches, throat pain, painful swallowing, coughing.  You can take this together with pseudoephedrine (Sudafed) at a dose of 60 mg 3 times a day or twice daily as needed for the same kind of nasal drip, congestion.

## 2022-09-26 NOTE — ED Provider Notes (Signed)
[facility]  Note:  This document was prepared using Systems analyst and may include unintentional dictation errors.  MRN: [id number] DOB: 1990/10/24  Subjective:   Tracey Ellison is a 32 y.o. female presenting for 2 week history of persistent sinus headaches, malaise, fatigue, scratchy throat.  Was seen at the end of January, felt like she had improvement and then in the past few days had a rapid turnaround.  No cough, chest pain, shortness of breath or wheezing.  No history of allergies.  No asthma.  No respiratory disorders.  Patient not a smoker.  She already had strep testing done and was negative including the throat culture.  No current facility-administered medications for this encounter.  Current Outpatient Medications:    Chlorphen-PE-Acetaminophen 4-10-325 MG TABS, Take 1 tablet by mouth 3 (three) times daily., Disp: 21 tablet, Rfl: 0   cloNIDine (CATAPRES) 0.1 MG tablet, Take 1 tablet (0.1 mg total) by mouth daily., Disp: 30 tablet, Rfl: 3   desvenlafaxine (PRISTIQ) 50 MG 24 hr tablet, Take 1 tablet (50 mg total) by mouth daily., Disp: 90 tablet, Rfl: 1   [name] 1-20 MG-MCG(24) tablet, Take 1 tablet by mouth daily., Disp: , Rfl:    levocetirizine (XYZAL) 5 MG tablet, levocetirizine 5 mg tablet  TAKE ONE TABLET BY MOUTH DAILY, Disp: , Rfl:    LORazepam (ATIVAN) 0.5 MG tablet, TAKE TWO TABLETS BY MOUTH EVERY EVENING AS NEEDED FOR ANXIETY, Disp: 60 tablet, Rfl: 2   NP THYROID 90 MG tablet, Take 90 mg by mouth daily., Disp: , Rfl:    QUEtiapine (SEROQUEL) 50 MG tablet, TAKE THREE TABLETS BY MOUTH EVERY NIGHT AT BEDTIME, Disp: 90 tablet, Rfl: 4   Allergies  Allergen Reactions   Metoclopramide Hcl Anaphylaxis    hives   Amitriptyline Other (See Comments)    Dizzy, anxiety, night terrors   Decadron [Dexamethasone]     Oral steroids causes her to have anxiety.  Patient reports that she can tolerate steroid injections without any  difficulty    Suboxone [Buprenorphine Hcl-Naloxone Hcl] Other (See Comments)    Chest tightness, trouble breathing, mouth swelling.     Past Medical History:  Diagnosis Date   Chronic headaches    Chronic insomnia 05/20/2018   Intractable chronic migraine without aura 10/26/2014   Raynauds phenomenon    Thyroid disease      Past Surgical History:  Procedure Laterality Date   WISDOM TOOTH EXTRACTION      Family History  Problem Relation Age of Onset   Hypertension Father    Leukemia Neg Hx        Hx. of leukemia on father's side of family   Lung cancer Neg Hx        Hx. of lung cancer on father's side of family   Breast cancer Neg Hx        Hx. of breast cancer on father's side of family   Cancer Neg Hx        Hx. of intestinal cancer on father's side of family    Social History   Tobacco Use   Smoking status: Never   Smokeless tobacco: Never  Vaping Use   Vaping Use: Never used  Substance Use Topics   Alcohol use: No   Drug use: No    Comment: Dilaudid withdrawal/last dose 04/15/20    ROS   Objective:   Vitals: BP (!) 135/98 (BP Location: Right Arm)   Pulse 85  Temp 98.7 F (37.1 C) (Oral)   Resp 20   SpO2 98%   Physical Exam Constitutional:      General: She is not in acute distress.    Appearance: Normal appearance. She is well-developed and normal weight. She is not ill-appearing, toxic-appearing or diaphoretic.  HENT:     Head: Normocephalic and atraumatic.     Right Ear: Tympanic membrane, ear canal and external ear normal. No drainage or tenderness. No middle ear effusion. There is no impacted cerumen. Tympanic membrane is not erythematous or bulging.     Left Ear: Tympanic membrane, ear canal and external ear normal. No drainage or tenderness.  No middle ear effusion. There is no impacted cerumen. Tympanic membrane is not erythematous or bulging.     Nose: Nose normal. No congestion or rhinorrhea.     Mouth/Throat:     Mouth: Mucous membranes  are moist. No oral lesions.     Pharynx: Posterior oropharyngeal erythema (with associated thick streaks of post-nasal drainage) present. No pharyngeal swelling, oropharyngeal exudate or uvula swelling.     Tonsils: No tonsillar exudate or tonsillar abscesses. 0 on the right. 0 on the left.  Eyes:     General: No scleral icterus.       Right eye: No discharge.        Left eye: No discharge.     Extraocular Movements: Extraocular movements intact.     Right eye: Normal extraocular motion.     Left eye: Normal extraocular motion.     Conjunctiva/sclera: Conjunctivae normal.  Cardiovascular:     Rate and Rhythm: Normal rate.  Pulmonary:     Effort: Pulmonary effort is normal.  Musculoskeletal:     Cervical back: Normal range of motion and neck supple.  Lymphadenopathy:     Cervical: No cervical adenopathy.  Skin:    General: Skin is warm and dry.  Neurological:     General: No focal deficit present.     Mental Status: She is alert and oriented to person, place, and time.  Psychiatric:        Mood and Affect: Mood normal.        Behavior: Behavior normal.     Assessment and Plan :   PDMP not reviewed this encounter.  1. Other acute sinusitis, recurrence not specified     Will start empiric treatment for sinusitis with Augmentin.  Recommended supportive care otherwise. Counseled patient on potential for adverse effects with medications prescribed/recommended today, ER and return-to-clinic precautions discussed, patient verbalized understanding.    Tracey Ellison, [place] 09/26/22 1325

## 2022-10-05 ENCOUNTER — Telehealth: Payer: Self-pay

## 2022-10-05 MED ORDER — FLUCONAZOLE 150 MG PO TABS: 150.0000 mg | ORAL_TABLET | ORAL | 0 refills | Status: DC

## 2022-10-05 NOTE — Telephone Encounter (Signed)
Message sent via mychart to patient letting her know Jaynee Eagles [facility] has sent script in.

## 2022-10-05 NOTE — Telephone Encounter (Signed)
Finished the 7 days of antibiotic and has developed a yeast infection. Would like something sent in to    [facility].

## 2022-10-05 NOTE — Telephone Encounter (Signed)
Prescription sent for fluconazole.  Please let her know.

## 2022-11-06 ENCOUNTER — Ambulatory Visit
Admission: EM | Admit: 2022-11-06 | Discharge: 2022-11-06 | Disposition: A | Discharge status: Home / Self Care | Payer: BC Managed Care – PPO | Attending: Nurse Practitioner | Admitting: Nurse Practitioner

## 2022-11-06 DIAGNOSIS — G43819 Other migraine, intractable, without status migrainosus: Secondary | ICD-10-CM | POA: Diagnosis not present

## 2022-11-06 MED ORDER — KETOROLAC TROMETHAMINE 60 MG/2ML IM SOLN
60.0000 mg | Freq: Once | INTRAMUSCULAR | Status: AC
  Administered 2022-11-06: 60 mg via INTRAMUSCULAR

## 2022-11-06 NOTE — ED Triage Notes (Signed)
Pt presents with a migraine since Saturday night.   States she was told to have a toradol shot.

## 2022-11-06 NOTE — ED Provider Notes (Signed)
[facility]    CSN: [id number] Arrival date & time: 11/06/22  1836      History   Chief Complaint Chief Complaint  Patient presents with   Migraine    HPI Tracey Ellison is a 32 y.o. female presents for evaluation of a headache.  Patient reports a long history of migraines and has been followed pain management and neurology.  Reports Saturday she developed a migraine that is located in the back of her head and has been constant since that time.  She denies that this is the worst headache of her life.  Has some mild photosensitivity but denies any nausea/vomiting, dizziness, visual changes, auras, syncope.  She does take Zofran if needed.  She took oral Toradol on Saturday and tramadol yesterday without any improvement.  She has not taken any over-the-counter NSAIDs for symptoms.  She states typical migraine medication such as triptans do not work for her.  No other concerns at this time.   Migraine Associated symptoms include headaches.    Past Medical History:  Diagnosis Date   Chronic headaches    Chronic insomnia 05/20/2018   Intractable chronic migraine without aura 10/26/2014   Raynauds phenomenon    Thyroid disease     Patient Active Problem List   Diagnosis Date Noted   Chronic insomnia 05/20/2018   Panic disorder 08/24/2015   Intractable chronic migraine without aura 10/26/2014   Sprain of neck 05/29/2012   Headache 05/29/2012    Past Surgical History:  Procedure Laterality Date   WISDOM TOOTH EXTRACTION      OB History   No obstetric history on file.      Home Medications    Prior to Admission medications   Medication Sig Start Date End Date Taking? Authorizing Provider  amoxicillin-clavulanate (AUGMENTIN) 875-125 MG tablet Take 1 tablet by mouth 2 (two) times daily. 09/26/22   Jaynee Eagles, PA-C  Chlorphen-PE-Acetaminophen 4-10-325 MG TABS Take 1 tablet by mouth 3 (three) times daily. 01/22/22   Enrique Sack, FNP  cloNIDine (CATAPRES) 0.1  MG tablet Take 1 tablet (0.1 mg total) by mouth daily. 06/15/20   Kathrynn Ducking, MD  desvenlafaxine (PRISTIQ) 50 MG 24 hr tablet Take 1 tablet (50 mg total) by mouth daily. 04/19/21   Kathrynn Ducking, MD  fluconazole (DIFLUCAN) 150 MG tablet Take 1 tablet (150 mg total) by mouth once a week. 10/05/22   Jaynee Eagles, PA-C  [name] 1-20 MG-MCG(24) tablet Take 1 tablet by mouth daily. 03/11/20   [provider]  levocetirizine (XYZAL) 5 MG tablet levocetirizine 5 mg tablet  TAKE ONE TABLET BY MOUTH DAILY 12/12/20   [provider]  LORazepam (ATIVAN) 0.5 MG tablet TAKE TWO TABLETS BY MOUTH EVERY EVENING AS NEEDED FOR ANXIETY 05/30/21   Kathrynn Ducking, MD  NP THYROID 90 MG tablet Take 90 mg by mouth daily. 04/07/20   [provider]  QUEtiapine (SEROQUEL) 50 MG tablet TAKE THREE TABLETS BY MOUTH EVERY NIGHT AT BEDTIME 04/17/21   Kathrynn Ducking, MD    Family History Family History  Problem Relation Age of Onset   Hypertension Father    Leukemia Neg Hx        Hx. of leukemia on father's side of family   Lung cancer Neg Hx        Hx. of lung cancer on father's side of family   Breast cancer Neg Hx        Hx. of breast cancer  on father's side of family   Cancer Neg Hx        Hx. of intestinal cancer on father's side of family    Social History Social History   Tobacco Use   Smoking status: Never   Smokeless tobacco: Never  Vaping Use   Vaping Use: Never used  Substance Use Topics   Alcohol use: No   Drug use: No    Comment: Dilaudid withdrawal/last dose 04/15/20     Allergies   Metoclopramide hcl, Amitriptyline, Decadron [dexamethasone], and Suboxone [buprenorphine hcl-naloxone hcl]   Review of Systems Review of Systems  Neurological:  Positive for headaches.     Physical Exam Triage Vital Signs ED Triage Vitals  Enc Vitals Group     BP 11/06/22 1855 (!) 151/103     Pulse Rate 11/06/22 1854 93     Resp 11/06/22 1854 18     Temp  11/06/22 1854 97.9 F (36.6 C)     Temp Source 11/06/22 1854 Oral     SpO2 11/06/22 1854 97 %     Weight --      Height --      Head Circumference --      Peak Flow --      Pain Score 11/06/22 1854 9     Pain Loc --      Pain Edu? --      Excl. in [place]? --    No data found.  Updated Vital Signs BP (!) 151/103 (BP Location: Left Arm)   Pulse 93   Temp 97.9 F (36.6 C) (Oral)   Resp 18   SpO2 97%   Visual Acuity Right Eye Distance:   Left Eye Distance:   Bilateral Distance:    Right Eye Near:   Left Eye Near:    Bilateral Near:     Physical Exam Vitals and nursing note reviewed.  Constitutional:      Appearance: Normal appearance.  HENT:     Head: Normocephalic and atraumatic.  Eyes:     Pupils: Pupils are equal, round, and reactive to light.  Cardiovascular:     Rate and Rhythm: Normal rate.  Pulmonary:     Effort: Pulmonary effort is normal.  Skin:    General: Skin is warm and dry.  Neurological:     General: No focal deficit present.     Mental Status: She is alert and oriented to person, place, and time.     GCS: GCS eye subscore is 4. GCS verbal subscore is 5. GCS motor subscore is 6.     Cranial Nerves: No facial asymmetry.     Motor: No weakness.  Psychiatric:        Mood and Affect: Mood normal.        Behavior: Behavior normal.      UC Treatments / Results  Labs (all labs ordered are listed, but only abnormal results are displayed) Labs Reviewed - No data to display  EKG   Radiology No results found.  Procedures Procedures (including critical care time)  Medications Ordered in UC Medications  ketorolac (TORADOL) injection 60 mg (60 mg Intramuscular Given 11/06/22 1916)    Initial Impression / Assessment and Plan / UC Course  I have reviewed the triage vital signs and the nursing notes.  Pertinent labs & imaging results that were available during my care of the patient were reviewed by me and considered in my medical decision making  (see chart for details).  Toradol injection in clinic.  Patient monitored for 10 minutes after injection with no reaction noted and tolerated well Patient structured no NSAIDs for 24 hours and she verbalized understanding. Advised to follow-up with her pain management/neurologist for further treatment options of her chronic migraines. ER precautions reviewed and patient verbalized understanding Final Clinical Impressions(s) / UC Diagnoses   Final diagnoses:  Other migraine without status migrainosus, intractable     Discharge Instructions      You were given a Toradol injection in clinic today. Do not take any over the counter NSAID's such as Advil, ibuprofen, Aleve, or naproxen for 24 hours.  You may take tylenol if needed      ED Prescriptions   None    PDMP not reviewed this encounter.   Melynda Ripple, NP 11/06/22 1921

## 2022-11-06 NOTE — Discharge Instructions (Addendum)
Patient declined AVS  You were given a Toradol injection in clinic today. Do not take any over the counter NSAID's such as Advil, ibuprofen, Aleve, or naproxen for 24 hours.  You may take tylenol if needed Follow-up with your neurologist/pain management provider for further treatment options Please go to the ER if you have any worsening symptoms

## 2024-07-07 ENCOUNTER — Ambulatory Visit
Admission: RE | Admit: 2024-07-07 | Discharge: 2024-07-07 | Disposition: A | Discharge status: Home / Self Care | Source: Ambulatory Visit | Attending: Urgent Care | Admitting: Urgent Care

## 2024-07-07 VITALS — BP 132/87 | HR 113 | Temp 97.7°F | Resp 18

## 2024-07-07 DIAGNOSIS — J019 Acute sinusitis, unspecified: Secondary | ICD-10-CM

## 2024-07-07 DIAGNOSIS — R070 Pain in throat: Secondary | ICD-10-CM | POA: Diagnosis not present

## 2024-07-07 DIAGNOSIS — B9689 Other specified bacterial agents as the cause of diseases classified elsewhere: Secondary | ICD-10-CM

## 2024-07-07 LAB — POCT RAPID STREP A (OFFICE): Rapid Strep A Screen: NEGATIVE

## 2024-07-07 MED ORDER — AMOXICILLIN-POT CLAVULANATE 875-125 MG PO TABS: 1.0000 | ORAL_TABLET | Freq: Two times a day (BID) | ORAL | 0 refills | Status: AC

## 2024-07-07 NOTE — ED Provider Notes (Addendum)
 [facility]  Note:  This document was prepared using Conservation officer, historic buildings and may include unintentional dictation errors.  MRN: [id number] DOB: 08-Aug-1991  Subjective:   Tracey Ellison is a 33 y.o. female presenting for 1 month history of persistent congestion, malaise, fatigue, headaches.  In the past day has had significant throat pain.  Symptoms started off with what felt like a cold that she has exposure to her family with kids.  No cough, chest pain, shortness of breath or wheezing.  No current facility-administered medications for this encounter.  Current Outpatient Medications:    traMADol  (ULTRAM ) 50 MG tablet, Take by mouth every 6 (six) hours as needed., Disp: , Rfl:    amoxicillin -clavulanate (AUGMENTIN ) 875-125 MG tablet, Take 1 tablet by mouth 2 (two) times daily., Disp: 14 tablet, Rfl: 0   Chlorphen-PE-Acetaminophen  4-10-325 MG TABS, Take 1 tablet by mouth 3 (three) times daily., Disp: 21 tablet, Rfl: 0   cloNIDine  (CATAPRES ) 0.1 MG tablet, Take 1 tablet (0.1 mg total) by mouth daily., Disp: 30 tablet, Rfl: 3   desvenlafaxine  (PRISTIQ ) 50 MG 24 hr tablet, Take 1 tablet (50 mg total) by mouth daily., Disp: 90 tablet, Rfl: 1   fluconazole  (DIFLUCAN ) 150 MG tablet, Take 1 tablet (150 mg total) by mouth once a week., Disp: 2 tablet, Rfl: 0   [name] 1-20 MG-MCG(24) tablet, Take 1 tablet by mouth daily., Disp: , Rfl:    levocetirizine (XYZAL) 5 MG tablet, levocetirizine 5 mg tablet  TAKE ONE TABLET BY MOUTH DAILY, Disp: , Rfl:    LORazepam  (ATIVAN ) 0.5 MG tablet, TAKE TWO TABLETS BY MOUTH EVERY EVENING AS NEEDED FOR ANXIETY, Disp: 60 tablet, Rfl: 2   NP THYROID 90 MG tablet, Take 90 mg by mouth daily., Disp: , Rfl:    QUEtiapine  (SEROQUEL ) 50 MG tablet, TAKE THREE TABLETS BY MOUTH EVERY NIGHT AT BEDTIME, Disp: 90 tablet, Rfl: 4   Allergies  Allergen Reactions   Buprenorphine Swelling and Other (See Comments)   Metoclopramide  Hcl  Anaphylaxis    hives   Amitriptyline  Other (See Comments)    Dizzy, anxiety, night terrors   Decadron  [Dexamethasone ]     Oral steroids causes her to have anxiety.  Patient reports that she can tolerate steroid injections without any difficulty    Doxycycline Other (See Comments)   Metoclopramide  Other (See Comments)   Suboxone [Buprenorphine Hcl-Naloxone Hcl] Other (See Comments)    Chest tightness, trouble breathing, mouth swelling.     Past Medical History:  Diagnosis Date   Chronic headaches    Chronic insomnia 05/20/2018   Intractable chronic migraine without aura 10/26/2014   Raynauds phenomenon    Thyroid disease      Past Surgical History:  Procedure Laterality Date   WISDOM TOOTH EXTRACTION      Family History  Problem Relation Age of Onset   Hypertension Father    Leukemia Neg Hx        Hx. of leukemia on father's side of family   Lung cancer Neg Hx        Hx. of lung cancer on father's side of family   Breast cancer Neg Hx        Hx. of breast cancer on father's side of family   Cancer Neg Hx        Hx. of intestinal cancer on father's side of family    Social History   Tobacco Use   Smoking status: Never  Smokeless tobacco: Never  Vaping Use   Vaping status: Never Used  Substance Use Topics   Alcohol use: No   Drug use: No    Comment: Dilaudid  withdrawal/last dose 04/15/20    ROS   Objective:   Vitals: BP 132/87 (BP Location: Right Arm)   Pulse (!) 113   Temp 97.7 F (36.5 C) (Oral)   Resp 18   SpO2 97%   Physical Exam Constitutional:      General: She is not in acute distress.    Appearance: Normal appearance. She is well-developed and normal weight. She is not ill-appearing, toxic-appearing or diaphoretic.  HENT:     Head: Normocephalic and atraumatic.     Right Ear: Tympanic membrane, ear canal and external ear normal. No drainage or tenderness. No middle ear effusion. There is no impacted cerumen. Tympanic membrane is not  erythematous or bulging.     Left Ear: Tympanic membrane, ear canal and external ear normal. No drainage or tenderness.  No middle ear effusion. There is no impacted cerumen. Tympanic membrane is not erythematous or bulging.     Nose: Congestion and rhinorrhea present.     Mouth/Throat:     Mouth: Mucous membranes are moist. No oral lesions.     Pharynx: Posterior oropharyngeal erythema present. No pharyngeal swelling, oropharyngeal exudate or uvula swelling.     Tonsils: No tonsillar exudate or tonsillar abscesses. 0 on the right. 0 on the left.     Comments: Thick streaks of postnasal drainage overlying pharynx. Eyes:     General: No scleral icterus.       Right eye: No discharge.        Left eye: No discharge.     Extraocular Movements: Extraocular movements intact.     Right eye: Normal extraocular motion.     Left eye: Normal extraocular motion.     Conjunctiva/sclera: Conjunctivae normal.  Neck:     Meningeal: Brudzinski's sign and Kernig's sign absent.  Cardiovascular:     Rate and Rhythm: Normal rate.  Pulmonary:     Effort: Pulmonary effort is normal.  Musculoskeletal:     Cervical back: Normal range of motion and neck supple.  Lymphadenopathy:     Cervical: No cervical adenopathy.  Skin:    General: Skin is warm and dry.  Neurological:     General: No focal deficit present.     Mental Status: She is alert and oriented to person, place, and time.     Cranial Nerves: No cranial nerve deficit.     Motor: No weakness.     Coordination: Coordination normal.     Gait: Gait normal.  Psychiatric:        Mood and Affect: Mood normal.        Behavior: Behavior normal.     Results for orders placed or performed during the hospital encounter of 07/07/24 (from the past 24 hours)  POCT rapid strep A     Status: None   Collection Time: 07/07/24 12:17 PM  Result Value Ref Range   Rapid Strep A Screen Negative Negative    Assessment and Plan :   PDMP not reviewed this  encounter.  1. Acute bacterial sinusitis   2. Throat pain    Will start empiric treatment for sinusitis with Augmentin .  Recommended supportive care otherwise. Counseled patient on potential for adverse effects with medications prescribed/recommended today, ER and return-to-clinic precautions discussed, patient verbalized understanding.     Christopher Savannah, [place] 07/07/24 1242

## 2024-07-07 NOTE — ED Triage Notes (Signed)
 Pt reports she's being sick with low energy, headache, congestion, headache x 1 month. Sore throat x 1 day on fire. OTC meds gives some relief.

## 2024-07-07 NOTE — Discharge Instructions (Addendum)
 We will manage this as a sinus infection with amoxicillin -clavulanate. For sore throat or cough try using a honey-based tea. Use 3 teaspoons of honey with juice squeezed from half lemon. Place shaved pieces of ginger into 1/2-1 cup of water and warm over stove top. Then mix the ingredients and repeat every 4 hours as needed. Please take ibuprofen 600mg  every 6 hours with food alternating with OR taken together with Tylenol  500mg -650mg  every 6 hours for throat pain, fevers, aches and pains. Hydrate very well with at least 2 liters of water. Eat light meals such as soups (chicken and noodles, vegetable, chicken and wild rice).  Do not eat foods that you are allergic to.  Taking an antihistamine like Zyrtec can help against postnasal drainage, sinus congestion which can cause sinus pain, sinus headaches, throat pain, painful swallowing, coughing.  You can take this together with nasal saline rinses.

## 2024-07-10 ENCOUNTER — Telehealth: Admitting: Emergency Medicine

## 2024-07-10 DIAGNOSIS — R300 Dysuria: Secondary | ICD-10-CM | POA: Diagnosis not present

## 2024-07-10 MED ORDER — FLUCONAZOLE 150 MG PO TABS: 150.0000 mg | ORAL_TABLET | Freq: Once | ORAL | 0 refills | Status: AC

## 2024-07-10 MED ORDER — CEPHALEXIN 500 MG PO CAPS: 500.0000 mg | ORAL_CAPSULE | Freq: Two times a day (BID) | ORAL | 0 refills | Status: AC

## 2024-07-10 NOTE — Progress Notes (Signed)
